# Patient Record
Sex: Male | Born: 1970 | Hispanic: No | Marital: Married | Smoking: Former smoker
Health system: Southern US, Community
[De-identification: ages and names within clinical notes are randomized; demographics above are authoritative.]

## PROBLEM LIST (undated history)

## (undated) DIAGNOSIS — Z9889 Other specified postprocedural states: Secondary | ICD-10-CM

## (undated) DIAGNOSIS — B20 Human immunodeficiency virus [HIV] disease: Secondary | ICD-10-CM

## (undated) DIAGNOSIS — F411 Generalized anxiety disorder: Secondary | ICD-10-CM

## (undated) DIAGNOSIS — K222 Esophageal obstruction: Secondary | ICD-10-CM

## (undated) DIAGNOSIS — Z5189 Encounter for other specified aftercare: Secondary | ICD-10-CM

## (undated) DIAGNOSIS — F4321 Adjustment disorder with depressed mood: Secondary | ICD-10-CM

## (undated) DIAGNOSIS — Z21 Asymptomatic human immunodeficiency virus [HIV] infection status: Secondary | ICD-10-CM

## (undated) DIAGNOSIS — D3610 Benign neoplasm of peripheral nerves and autonomic nervous system, unspecified: Secondary | ICD-10-CM

## (undated) DIAGNOSIS — J302 Other seasonal allergic rhinitis: Secondary | ICD-10-CM

## (undated) DIAGNOSIS — Z87442 Personal history of urinary calculi: Secondary | ICD-10-CM

## (undated) DIAGNOSIS — I639 Cerebral infarction, unspecified: Secondary | ICD-10-CM

## (undated) DIAGNOSIS — J189 Pneumonia, unspecified organism: Secondary | ICD-10-CM

## (undated) DIAGNOSIS — I10 Essential (primary) hypertension: Secondary | ICD-10-CM

## (undated) HISTORY — DX: Benign neoplasm of peripheral nerves and autonomic nervous system, unspecified: D36.10

## (undated) HISTORY — DX: Esophageal obstruction: K22.2

## (undated) HISTORY — PX: BRAIN SURGERY: SHX531

## (undated) HISTORY — DX: Generalized anxiety disorder: F41.1

## (undated) HISTORY — DX: Other specified postprocedural states: Z98.890

## (undated) HISTORY — DX: Other seasonal allergic rhinitis: J30.2

## (undated) HISTORY — PX: FRACTURE SURGERY: SHX138

## (undated) HISTORY — DX: Essential (primary) hypertension: I10

## (undated) HISTORY — PX: ESOPHAGOGASTRODUODENOSCOPY: SHX1529

## (undated) HISTORY — DX: Human immunodeficiency virus (HIV) disease: B20

## (undated) HISTORY — PX: COLONOSCOPY: SHX174

## (undated) HISTORY — DX: Encounter for other specified aftercare: Z51.89

## (undated) HISTORY — DX: Adjustment disorder with depressed mood: F43.21

---

## 2015-07-11 ENCOUNTER — Encounter (HOSPITAL_BASED_OUTPATIENT_CLINIC_OR_DEPARTMENT_OTHER): Payer: Self-pay

## 2015-07-11 ENCOUNTER — Emergency Department (HOSPITAL_BASED_OUTPATIENT_CLINIC_OR_DEPARTMENT_OTHER)
Admission: EM | Admit: 2015-07-11 | Discharge: 2015-07-11 | Disposition: A | Discharge status: Home / Self Care | Payer: Self-pay | Attending: Emergency Medicine | Admitting: Emergency Medicine

## 2015-07-11 DIAGNOSIS — Y939 Activity, unspecified: Secondary | ICD-10-CM | POA: Insufficient documentation

## 2015-07-11 DIAGNOSIS — X58XXXA Exposure to other specified factors, initial encounter: Secondary | ICD-10-CM | POA: Insufficient documentation

## 2015-07-11 DIAGNOSIS — Y998 Other external cause status: Secondary | ICD-10-CM | POA: Insufficient documentation

## 2015-07-11 DIAGNOSIS — T189XXA Foreign body of alimentary tract, part unspecified, initial encounter: Secondary | ICD-10-CM | POA: Insufficient documentation

## 2015-07-11 DIAGNOSIS — Y929 Unspecified place or not applicable: Secondary | ICD-10-CM | POA: Insufficient documentation

## 2015-07-11 HISTORY — DX: Cerebral infarction, unspecified: I63.9

## 2015-07-11 HISTORY — DX: Asymptomatic human immunodeficiency virus (hiv) infection status: Z21

## 2015-07-11 NOTE — ED Notes (Signed)
Pt angry with first ? Asked in triage cont'd angry to answer triage ?s-pt yelled at me to remove the food-calmly advised pt of triage process, ED limitations for removal and the possible need for endoscopy-pt angrily states he is leaving-adult male with pt talked him into staying-pt yelled at me to "just ask the damn questions"-explained to pt that any ED would need to ask preliminary ?s prior to any exam/treament-pt cont'd to be angry-left triage with fast paced gait-cursing-again pt was in no resp distress, was handling own secretions

## 2015-07-11 NOTE — ED Notes (Addendum)
Pt noted amb with quick steady gait, resp easy and reg, out of triage area by this nurse first. Pt shouts at this rn, "this is a fucking joke of an ER!" and walks out doors with his companion.

## 2015-07-11 NOTE — ED Notes (Signed)
Pt presents with c/o of having food stuck in throat-no resp distress

## 2015-08-29 ENCOUNTER — Ambulatory Visit (INDEPENDENT_AMBULATORY_CARE_PROVIDER_SITE_OTHER): Payer: BLUE CROSS/BLUE SHIELD

## 2015-08-29 ENCOUNTER — Ambulatory Visit (INDEPENDENT_AMBULATORY_CARE_PROVIDER_SITE_OTHER): Payer: BLUE CROSS/BLUE SHIELD | Admitting: Podiatry

## 2015-08-29 VITALS — BP 122/79 | HR 74 | Resp 16

## 2015-08-29 DIAGNOSIS — G5761 Lesion of plantar nerve, right lower limb: Secondary | ICD-10-CM

## 2015-08-29 DIAGNOSIS — M79673 Pain in unspecified foot: Secondary | ICD-10-CM

## 2015-08-29 DIAGNOSIS — M216X9 Other acquired deformities of unspecified foot: Secondary | ICD-10-CM | POA: Diagnosis not present

## 2015-08-29 DIAGNOSIS — G5781 Other specified mononeuropathies of right lower limb: Secondary | ICD-10-CM

## 2015-08-29 NOTE — Patient Instructions (Signed)
Morton Neuralgia  Morton neuralgia is a type of foot pain in the area closest to your toes. This area is sometimes called the ball of your foot. Morton neuralgia occurs when a branch of a nerve in your foot (digital nerve) becomes compressed.   When this happens over a long period of time, the nerve can thicken (neuroma) and cause pain. This usually occurs between the third and fourth toe. Morton neuralgia can come and go but may get worse over time.   CAUSES  Your digital nerve can become compressed and stretched at a point where it passes under a thick band of tissue that connects your toes (intermetatarsal ligament). Morton neuralgia can be caused by mild repetitive damage in this area. This type of damage can result from:   · Activities such as running or jumping.  · Wearing shoes that are too tight.  RISK FACTORS  You may be at risk for Morton neuralgia if you:  · Are male.  · Wear high heels.  · Wear shoes that are narrow or tight.  · Participate in activities that stretch your toes. These include:  ¨ Running.  ¨ Ballet.  ¨ Long-distance walking.  SIGNS AND SYMPTOMS  The first symptom of Morton neuralgia is pain that spreads from the ball of your foot to your toes. It may feel like you are walking on a marble. Pain usually gets worse with walking and goes away at night. Other symptoms may include numbness and cramping of your toes.  DIAGNOSIS   Your health care provider will do a physical exam. When doing the exam, your health care provider may:   · Squeeze your foot just behind your toe.  · Ask you to move your toes to check for pain.  You may also have tests on your foot to confirm the diagnosis. These may include:   · An X-ray.  · An MRI.  TREATMENT   Treatment for Morton neuralgia may be as simple as changing the kind of shoes you wear. Other treatments may include:  · Wearing a supportive pad (orthosis) under the front of your foot. This lifts your toe bones and takes pressure off the nerve.  · Getting  injections of numbing medicine and anti-inflammatory medicine (steroid) in the nerve.  · Having surgery to remove part of the thickened nerve.  HOME CARE INSTRUCTIONS   · Take medicine only as directed by your health care provider.  · Wear soft-soled shoes with a wide toe area.  · Stop activities that may be causing pain.  · Elevate your foot when resting.  · Massage your foot.  · Apply ice to the injured area:      Put ice in a plastic bag.    Place a towel between your skin and the bag.    Leave the ice on for 20 minutes, 2-3 times a day.    · Keep all follow-up visits as directed by your health care provider. This is important.  SEEK MEDICAL CARE IF:  · Home care instructions are not helping you get better.  · Your symptoms change or get worse.     This information is not intended to replace advice given to you by your health care provider. Make sure you discuss any questions you have with your health care provider.     Document Released: 08/18/2000 Document Revised: 06/02/2014 Document Reviewed: 07/13/2013  Elsevier Interactive Patient Education ©2016 [employer]

## 2015-08-29 NOTE — Progress Notes (Signed)
   Subjective:    Patient ID: Bradley Chandler, male    DOB: [birth year], 45 y.o.   MRN: [id number]  HPI    Review of Systems  All other systems reviewed and are negative.      Objective:   Physical Exam        Assessment & Plan:

## 2015-08-30 NOTE — Progress Notes (Signed)
Subjective:     Patient ID: Bradley Chandler, male   DOB: [birth year], 45 y.o.   MRN: [id number]  HPI patient states I've had shooting pain with radiating-like discomfort between the third and fourth toe on my right foot that a been present for several years. I'm very active and it done triathlons in the past and the discomfort and numbness has reduced my ability to be active   Review of Systems  All other systems reviewed and are negative.      Objective:   Physical Exam  Constitutional: He is oriented to person, place, and time.  Cardiovascular: Intact distal pulses.   Musculoskeletal: Normal range of motion.  Neurological: He is oriented to person, place, and time.  Skin: Skin is warm.  Nursing note and vitals reviewed.  neurovascular status intact muscle strength adequate range of motion within normal limits with patient found to have shooting discomforts between the third and fourth toe with the right foot with radiating-like pain and a positive Mulder sign when palpated. 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 Reappoint 2 weeks or earlier if any issues were to occur

## 2015-09-13 ENCOUNTER — Ambulatory Visit (INDEPENDENT_AMBULATORY_CARE_PROVIDER_SITE_OTHER): Payer: BLUE CROSS/BLUE SHIELD | Admitting: Podiatry

## 2015-09-13 ENCOUNTER — Encounter: Payer: Self-pay | Admitting: Podiatry

## 2015-09-13 DIAGNOSIS — G5761 Lesion of plantar nerve, right lower limb: Secondary | ICD-10-CM

## 2015-09-13 DIAGNOSIS — G5781 Other specified mononeuropathies of right lower limb: Secondary | ICD-10-CM

## 2015-09-13 NOTE — Patient Instructions (Signed)
Pre-Operative Instructions  Congratulations, you have decided to take an important step to improving your quality of life.  You can be assured that the doctors of [facility] will be with you every step of the way.  1. Plan to be at the surgery center/hospital at least 1 (one) hour prior to your scheduled time unless otherwise directed by the surgical center/hospital staff.  You must have a responsible adult accompany you, remain during the surgery and drive you home.  Make sure you have directions to the surgical center/hospital and know how to get there on time. 2. For hospital based surgery you will need to obtain a history and physical form from your family physician within 1 month prior to the date of surgery- we will give you a form for you primary physician.  3. We make every effort to accommodate the date you request for surgery.  There are however, times where surgery dates or times have to be moved.  We will contact you as soon as possible if a change in schedule is required.   4. No Aspirin/Ibuprofen for one week before surgery.  If you are on aspirin, any non-steroidal anti-inflammatory medications (Mobic, Aleve, Ibuprofen) you should stop taking it 7 days prior to your surgery.  You make take Tylenol  For pain prior to surgery.  5. Medications- If you are taking daily heart and blood pressure medications, seizure, reflux, allergy, asthma, anxiety, pain or diabetes medications, make sure the surgery center/hospital is aware before the day of surgery so they may notify you which medications to take or avoid the day of surgery. 6. No food or drink after midnight the night before surgery unless directed otherwise by surgical center/hospital staff. 7. No alcoholic beverages 24 hours prior to surgery.  No smoking 24 hours prior to or 24 hours after surgery. 8. Wear loose pants or shorts- loose enough to fit over bandages, boots, and casts. 9. No slip on shoes, sneakers are best. 10. Bring  your boot with you to the surgery center/hospital.  Also bring crutches or a walker if your physician has prescribed it for you.  If you do not have this equipment, it will be provided for you after surgery. 11. If you have not been contracted by the surgery center/hospital by the day before your surgery, call to confirm the date and time of your surgery. 12. Leave-time from work may vary depending on the type of surgery you have.  Appropriate arrangements should be made prior to surgery with your employer. 13. Prescriptions will be provided immediately following surgery by your doctor.  Have these filled as soon as possible after surgery and take the medication as directed. 14. Remove nail polish on the operative foot. 15. Wash the night before surgery.  The night before surgery wash the foot and leg well with the antibacterial soap provided and water paying special attention to beneath the toenails and in between the toes.  Rinse thoroughly with water and dry well with a towel.  Perform this wash unless told not to do so by your physician.  Enclosed: 1 Ice pack (please put in freezer the night before surgery)   1 Hibiclens skin cleaner   Pre-op Instructions  If you have any questions regarding the instructions, do not hesitate to call our office.  [place]: [address] [place], [place] [address] [phone]  [place]: [address] [place], [place] [address] [phone]  [place]: [address]  [place], [place] [address] [phone]  Dr. Richard   Tuchman DPM, Dr. Ej Pinson DPM Dr. Richard Sikora DPM, Dr. M. Todd Hyatt DPM, Dr. Kathryn Egerton DPM 

## 2015-09-16 NOTE — Progress Notes (Signed)
Subjective:     Patient ID: Bradley Chandler, male   DOB: 09/24/1970, 45 y.o.   MRN: [id number]  HPI patient states he had 1-1/2 days of complete relief from the neuroma injection and now has recurrence of symptoms and would like to have it taken care of permanently   Review of Systems     Objective:   Physical Exam Neurovascular status intact muscle strength adequate with positive Biagio Borg sign with a palpable mass within the third interspace right that is painful when I pressed the metatarsals together and pushed on the interspace. Negative pain within the capsules of the third and fourth metatarsals    Assessment:     Strong probability for neuroma symptomatology right    Plan:     Due to the response to the conservative injection treatment and the area that the pain is and I do believe we are dealing with neuroma symptomatology and I discussed neurectomy. Patient wants the surgery and at this time I allowed him to read consent form reviewing alternative treatments and complications associated with surgery and the fact that there is no long-term guarantees as far as long-term success and he will develop some numbness between the toes. Patient understands all of this signs consent form and understands recovery can take approximately 3-6 months and is scheduled for outpatient surgery. He is encouraged to call with any questions prior to procedure

## 2015-09-27 ENCOUNTER — Telehealth: Payer: Self-pay | Admitting: *Deleted

## 2015-09-27 NOTE — Telephone Encounter (Signed)
"  I want to postpone my surgery scheduled for Tuesday."  Do you have a date you would like to reschedule to?  "No, I don't want to do it at this time.  I have some family issues I have to take care of.  I'm actually headed out of town now for that reason."  I'll let Dr. Paulla Dolly know and I will cancel it at the surgical center.  "Will you cancel my follow-up appointment too?"  Yes, I'll take care of that as well.  I called and left a message for Caren Griffins at the surgical center to cancel surgery.

## 2015-09-27 NOTE — Telephone Encounter (Signed)
"  I'm scheduled for surgery on this Tuesday.  I actually need to postpone that.  If you could give me a phone call back."

## 2015-10-10 ENCOUNTER — Ambulatory Visit (INDEPENDENT_AMBULATORY_CARE_PROVIDER_SITE_OTHER): Payer: Medicare Other | Admitting: Infectious Disease

## 2015-10-10 ENCOUNTER — Encounter: Payer: Self-pay | Admitting: Infectious Disease

## 2015-10-10 VITALS — BP 129/90 | HR 59 | Temp 98.1°F | Ht 66.0 in | Wt 150.8 lb

## 2015-10-10 DIAGNOSIS — Q394 Esophageal web: Secondary | ICD-10-CM | POA: Diagnosis not present

## 2015-10-10 DIAGNOSIS — B20 Human immunodeficiency virus [HIV] disease: Secondary | ICD-10-CM

## 2015-10-10 DIAGNOSIS — F411 Generalized anxiety disorder: Secondary | ICD-10-CM

## 2015-10-10 DIAGNOSIS — Z9889 Other specified postprocedural states: Secondary | ICD-10-CM

## 2015-10-10 DIAGNOSIS — G5762 Lesion of plantar nerve, left lower limb: Secondary | ICD-10-CM | POA: Insufficient documentation

## 2015-10-10 DIAGNOSIS — D3610 Benign neoplasm of peripheral nerves and autonomic nervous system, unspecified: Secondary | ICD-10-CM

## 2015-10-10 DIAGNOSIS — I10 Essential (primary) hypertension: Secondary | ICD-10-CM

## 2015-10-10 DIAGNOSIS — K222 Esophageal obstruction: Secondary | ICD-10-CM

## 2015-10-10 DIAGNOSIS — F4321 Adjustment disorder with depressed mood: Secondary | ICD-10-CM

## 2015-10-10 DIAGNOSIS — J302 Other seasonal allergic rhinitis: Secondary | ICD-10-CM

## 2015-10-10 HISTORY — DX: Other seasonal allergic rhinitis: J30.2

## 2015-10-10 HISTORY — DX: Adjustment disorder with depressed mood: F43.21

## 2015-10-10 HISTORY — DX: Other specified postprocedural states: Z98.890

## 2015-10-10 HISTORY — DX: Esophageal obstruction: K22.2

## 2015-10-10 HISTORY — DX: Benign neoplasm of peripheral nerves and autonomic nervous system, unspecified: D36.10

## 2015-10-10 HISTORY — DX: Generalized anxiety disorder: F41.1

## 2015-10-10 HISTORY — DX: Essential (primary) hypertension: I10

## 2015-10-10 HISTORY — DX: Human immunodeficiency virus (HIV) disease: B20

## 2015-10-10 NOTE — Progress Notes (Signed)
Subjective:   Chief complaint: patient desires to have ID MD and PCP in one clinic   Patient ID: Bradley Chandler, male    DOB: [birth year], 45 y.o.   MRN: [id number]  HPI  45 year old man with HIV that has been perfectly controlled with "undetectable viral load for 22 years" most recently on Atripla, then [place] and now Atripla again. He previously was taken care of by prominent ID MD in [place] then by MD in [place]. He has been taken care of since move to [place] (lives in [place]) by Dr. Remonia Richter. Dr. Rebekah Chesterfield had reached out to me to see if I could take on patient's care here in [place] since it would be closer and more convenient for the patient.   Apparently the larger issue is that the patient wishes to have an MD who is both his "HIV/ID doctor and his primary care physician:" He wanted to know if I could do both and I said I could within certain limitations--for example I am not comfortable managing insulin.  He was not sufficiently convinced that I could apparently and I proposed the idea of him seeing Dr. Redmond School who practices primary care and I believe also manages several patients HIV.   I offered to continue to be his ID provider and encouraged him to make a followup appt with me.   One item of concern for him is to have his lorazepam renewed. I told him that OUR clinic does not prescribe benzodiazepenes, narcotics or other controlled substance to new patients due to the massive problems with diversion that we dealt with in the past.  Past Medical History  Diagnosis Date  . HIV positive ([place])   . Stroke ([place])   . HIV disease ([place]) 10/10/2015  . Schatzki's ring 10/10/2015  . History of esophagogastroduodenoscopy (EGD) 10/10/2015  . Neuroma 10/10/2015  . GAD (generalized anxiety disorder) 10/10/2015  . Benign essential HTN 10/10/2015  . Seasonal allergies 10/10/2015  . Situational depression 10/10/2015    No past surgical history on file.  No family history on  file.    Social History   Social History  . Marital Status: Married    Spouse Name: N/A  . Number of Children: N/A  . Years of Education: N/A   Social History Main Topics  . Smoking status: Never Smoker   . Smokeless tobacco: None  . Alcohol Use: None  . Drug Use: None  . Sexual Activity: Not Asked   Other Topics Concern  . None   Social History Narrative    Allergies  Allergen Reactions  . Other Other (See Comments)    Dairy-severe abd pain     Current outpatient prescriptions:  .  buPROPion (WELLBUTRIN XL) 300 MG 24 hr tablet, Take 300 mg by mouth., Disp: , Rfl:  .  carvedilol (COREG) 6.25 MG tablet, , Disp: , Rfl:  .  efavirenz-emtricitabine-tenofovir (ATRIPLA) 600-200-300 MG tablet, Take by mouth., Disp: , Rfl:  .  fluticasone (FLONASE) 50 MCG/ACT nasal spray, , Disp: , Rfl:  .  fluticasone (FLONASE) 50 MCG/ACT nasal spray, , Disp: , Rfl:  .  glucosamine-chondroitin (GLUCOSAMINE-CHONDROITIN DS) 500-400 MG tablet, Take by mouth., Disp: , Rfl:  .  hydrochlorothiazide (HYDRODIURIL) 12.5 MG tablet, Take 12.5 mg by mouth., Disp: , Rfl:  .  Multiple Vitamin (MULTIVITAMIN) capsule, Take by mouth., Disp: , Rfl:  .  Omega-3 1000 MG CAPS, Take by mouth., Disp: , Rfl:  .  sertraline (  ZOLOFT) 50 MG tablet, Take 50 mg by mouth., Disp: , Rfl:    Review of Systems  Constitutional: Negative for fever, chills, diaphoresis, activity change, appetite change, fatigue and unexpected weight change.  HENT: Negative for congestion, rhinorrhea, sinus pressure, sneezing, sore throat and trouble swallowing.   Eyes: Negative for photophobia and visual disturbance.  Respiratory: Negative for cough, chest tightness, shortness of breath, wheezing and stridor.   Cardiovascular: Negative for chest pain, palpitations and leg swelling.  Gastrointestinal: Negative for nausea, vomiting, abdominal pain, diarrhea, constipation, blood in stool, abdominal distention and anal bleeding.  Genitourinary:  Negative for dysuria, hematuria, flank pain and difficulty urinating.  Musculoskeletal: Negative for myalgias, back pain, joint swelling, arthralgias and gait problem.  Skin: Negative for color change, pallor, rash and wound.  Neurological: Negative for dizziness, tremors, weakness and light-headedness.  Hematological: Negative for adenopathy. Does not bruise/bleed easily.  Psychiatric/Behavioral: Negative for behavioral problems, confusion, sleep disturbance, dysphoric mood, decreased concentration and agitation.       Objective:   Physical Exam  Constitutional: He is oriented to person, place, and time. He appears well-developed and well-nourished.  HENT:  Head: Normocephalic and atraumatic.  Eyes: Conjunctivae and EOM are normal.  Neck: Normal range of motion. Neck supple.  Cardiovascular: Normal rate and regular rhythm.   Pulmonary/Chest: Effort normal. No respiratory distress. He has no wheezes.  Abdominal: Soft. He exhibits no distension.  Musculoskeletal: Normal range of motion. He exhibits no edema or tenderness.  Neurological: He is alert and oriented to person, place, and time.  Skin: Skin is warm and dry. No rash noted. No erythema. No pallor.  Psychiatric: He has a normal mood and affect. Judgment and thought content normal.          Assessment & Plan:   HIV: IF he wished to have me be his ID MD that we might wish to "tinker" with his STR to find one that is more bone and kidney friendly than his current TDF based one with Atripla. Similarly the EFV not as desirable in a patient with depression and anxiety  Anxiety and depression: on SSRI and also lorazepam that he is getting from MD in [facility].   HTN: BP well controlled  I am referring him to Dr. Redmond School for primary care. Perhaps that situation will be more to his liking. I dont think the idea of an ID MD being a sub specialist and a patient needing also to have a Primary Care MD is unique to [place]. Certainly he would  benefit from standpoint of access to have both an ID MD and a primary care MD.

## 2015-10-12 ENCOUNTER — Other Ambulatory Visit: Payer: Self-pay

## 2015-10-15 ENCOUNTER — Telehealth: Payer: Self-pay | Admitting: Family Medicine

## 2015-10-15 NOTE — Telephone Encounter (Signed)
Dr. Redmond School request I call pt to see if he wanted him to care for his HIV care as he had been referred by his ID doc.  No answer lmtrc

## 2015-11-01 ENCOUNTER — Encounter: Payer: Self-pay | Admitting: *Deleted

## 2016-01-10 ENCOUNTER — Ambulatory Visit: Payer: BLUE CROSS/BLUE SHIELD | Admitting: Infectious Disease

## 2016-01-16 ENCOUNTER — Telehealth: Payer: Self-pay | Admitting: *Deleted

## 2016-01-16 NOTE — Telephone Encounter (Signed)
Called the patient to advise that [facility] wants him to have a PCP and we arranged for him to be seen by Dr Demetrios Isaacs and the patient has not responded to their calls. Since his insurance does not require a referral for PCP sent him a list to chose from to make his own appt when he wants.

## 2017-04-24 DIAGNOSIS — Z006 Encounter for examination for normal comparison and control in clinical research program: Secondary | ICD-10-CM | POA: Insufficient documentation

## 2018-07-03 ENCOUNTER — Emergency Department (HOSPITAL_BASED_OUTPATIENT_CLINIC_OR_DEPARTMENT_OTHER)
Admission: EM | Admit: 2018-07-03 | Discharge: 2018-07-03 | Disposition: A | Discharge status: Home / Self Care | Payer: BLUE CROSS/BLUE SHIELD | Attending: Emergency Medicine | Admitting: Emergency Medicine

## 2018-07-03 ENCOUNTER — Other Ambulatory Visit: Payer: Self-pay

## 2018-07-03 ENCOUNTER — Encounter (HOSPITAL_BASED_OUTPATIENT_CLINIC_OR_DEPARTMENT_OTHER): Payer: Self-pay | Admitting: *Deleted

## 2018-07-03 DIAGNOSIS — Y9389 Activity, other specified: Secondary | ICD-10-CM | POA: Insufficient documentation

## 2018-07-03 DIAGNOSIS — Z21 Asymptomatic human immunodeficiency virus [HIV] infection status: Secondary | ICD-10-CM | POA: Insufficient documentation

## 2018-07-03 DIAGNOSIS — Y929 Unspecified place or not applicable: Secondary | ICD-10-CM | POA: Diagnosis not present

## 2018-07-03 DIAGNOSIS — Z79899 Other long term (current) drug therapy: Secondary | ICD-10-CM | POA: Insufficient documentation

## 2018-07-03 DIAGNOSIS — Y999 Unspecified external cause status: Secondary | ICD-10-CM | POA: Insufficient documentation

## 2018-07-03 DIAGNOSIS — W25XXXA Contact with sharp glass, initial encounter: Secondary | ICD-10-CM | POA: Insufficient documentation

## 2018-07-03 DIAGNOSIS — S61212A Laceration without foreign body of right middle finger without damage to nail, initial encounter: Secondary | ICD-10-CM | POA: Diagnosis present

## 2018-07-03 DIAGNOSIS — I10 Essential (primary) hypertension: Secondary | ICD-10-CM | POA: Insufficient documentation

## 2018-07-03 MED ORDER — LIDOCAINE HCL (PF) 1 % IJ SOLN
5.0000 mL | Freq: Once | INTRAMUSCULAR | Status: AC
  Administered 2018-07-03: 5 mL via INTRADERMAL
  Filled 2018-07-03: qty 5

## 2018-07-03 MED ORDER — BACITRACIN ZINC 500 UNIT/GM EX OINT
TOPICAL_OINTMENT | Freq: Once | CUTANEOUS | Status: AC
  Administered 2018-07-03: 1 via TOPICAL

## 2018-07-03 NOTE — ED Triage Notes (Signed)
Laceration to right hand fingers from crock pot insert breaking

## 2018-07-03 NOTE — Discharge Instructions (Signed)
Keep the wound clean and dry for the first 24 hours. After that you may gently clean the wound with soap and water. Make sure to pat dry the wound before covering it with any dressing. You can use topical antibiotic ointment and bandage. Ice and elevate for pain relief.  ° °You can take Tylenol or Ibuprofen as directed for pain. You can alternate Tylenol and Ibuprofen every 4 hours for additional pain relief.  ° °Return to the Emergency Department, your primary care doctor, or the [facility] in 5-7 days for suture removal.  ° °Monitor closely for any signs of infection. Return to the Emergency Department for any worsening redness/swelling of the area that begins to spread, drainage from the site, worsening pain, fever or any other worsening or concerning symptoms.  ° ° °

## 2018-07-03 NOTE — ED Notes (Signed)
PMS intact before and after. Pt tolerated well. All questions answered. 

## 2018-07-03 NOTE — ED Provider Notes (Signed)
[place] EMERGENCY DEPARTMENT Provider Note   CSN: [id number] Arrival date & time: 07/03/18  2057     History   Chief Complaint Chief Complaint  Patient presents with  . Laceration    HPI Bradley Chandler is a 48 y.o. male for evaluation of right 3rd digit laceration that occurred just prior to ED arrival.  Patient reports that he was using a crockpot and states that when he put it away, piece of the crockpot broke, breaking on the volar aspect of his finger.  His tetanus is up-to-date.  He denies any numbness/weakness.  The history is provided by the patient.    Past Medical History:  Diagnosis Date  . Benign essential HTN 10/10/2015  . GAD (generalized anxiety disorder) 10/10/2015  . History of esophagogastroduodenoscopy (EGD) 10/10/2015  . HIV disease ([place]) 10/10/2015  . HIV positive ([place])   . Neuroma 10/10/2015  . Schatzki's ring 10/10/2015  . Seasonal allergies 10/10/2015  . Situational depression 10/10/2015  . Stroke [facility])     Patient Active Problem List   Diagnosis Date Noted  . HIV disease ([place]) 10/10/2015  . Schatzki's ring 10/10/2015  . History of esophagogastroduodenoscopy (EGD) 10/10/2015  . Neuroma 10/10/2015  . GAD (generalized anxiety disorder) 10/10/2015  . Benign essential HTN 10/10/2015  . Seasonal allergies 10/10/2015  . Situational depression 10/10/2015    Past Surgical History:  Procedure Laterality Date  . BRAIN SURGERY          Home Medications    Prior to Admission medications   Medication Sig Start Date End Date Taking? Authorizing Provider  bictegravir-emtricitabine-tenofovir AF (BIKTARVY) 50-200-25 MG TABS tablet Take by mouth daily.   Yes [provider]  buPROPion (WELLBUTRIN XL) 300 MG 24 hr tablet Take 300 mg by mouth. 07/13/15  Yes [provider]  carvedilol (COREG) 6.25 MG tablet  06/14/15  Yes [provider]  fluticasone Asencion Islam) 50 MCG/ACT nasal spray  07/13/15 07/03/18 Yes [provider]  glucosamine-chondroitin (GLUCOSAMINE-CHONDROITIN DS) 500-400 MG tablet Take by mouth.   Yes [provider]  hydrochlorothiazide (HYDRODIURIL) 12.5 MG tablet Take 12.5 mg by mouth. 03/15/15 07/03/18 Yes [provider]  Multiple Vitamin (MULTIVITAMIN) capsule Take by mouth.   Yes [provider]  Omega-3 1000 MG CAPS Take by mouth.   Yes [provider]  efavirenz-emtricitabine-tenofovir (ATRIPLA) 600-200-300 MG tablet Take by mouth. 07/27/15 07/26/16  [provider]  fluticasone Asencion Islam) 50 MCG/ACT nasal spray  07/13/15 07/12/16  [provider]  sertraline (ZOLOFT) 50 MG tablet Take 50 mg by mouth. 05/11/15   [provider]    Family History No family history on file.  Social History Social History   Tobacco Use  . Smoking status: Never Smoker  . Smokeless tobacco: Never Used  Substance Use Topics  . Alcohol use: Yes    Alcohol/week: 0.0 standard drinks    Comment: 2x month  . Drug use: Never     Allergies   Other   Review of Systems Review of Systems  Skin: Positive for wound.  Neurological: Negative for weakness and numbness.  All other systems reviewed and are negative.    Physical Exam Updated Vital Signs BP (!) 120/93 (BP Location: Left Arm)   Pulse 74   Temp 97.7 F (36.5 C) (Oral)   Resp 18   Ht 5\' 6"  (1.676 m)   Wt 72.6 kg   SpO2 96%   BMI 25.82 kg/m   Physical Exam Vitals signs and  nursing note reviewed.  Constitutional:      Appearance: He is well-developed.  HENT:     Head: Normocephalic and atraumatic.  Eyes:     General: No scleral icterus.       Right eye: No discharge.        Left eye: No discharge.     Conjunctiva/sclera: Conjunctivae normal.  Cardiovascular:     Pulses:          Radial pulses are 2+ on the right side and 2+ on the left side.  Pulmonary:     Effort: Pulmonary effort is normal.  Musculoskeletal:     Comments: Flexion/extension of right third  digit intact with any difficulty.  He can flex and extend at both the PIP and the DIP joint.  Skin:    General: Skin is warm and dry.     Capillary Refill: Capillary refill takes less than 2 seconds.     Comments: Good distal cap refill.  RUE is not dusky in appearance or cool to touch.  2 cm V-shaped jagged laceration noted to the volar aspect of the right third digit overlying the PIP joint.  Neurological:     Mental Status: He is alert.     Comments: Sensation intact along major nerve distributions of RUE  Psychiatric:        Speech: Speech normal.        Behavior: Behavior normal.      ED Treatments / Results  Labs (all labs ordered are listed, but only abnormal results are displayed) Labs Reviewed - No data to display  EKG None  Radiology No results found.  Procedures .Marland KitchenLaceration Repair Date/Time: 07/03/2018 9:48 PM Performed by: Volanda Napoleon, PA-C Authorized by: Volanda Napoleon, PA-C   Consent:    Consent obtained:  Verbal   Consent given by:  Patient   Risks discussed:  Infection, need for additional repair, pain, poor cosmetic result and poor wound healing   Alternatives discussed:  No treatment and delayed treatment Universal protocol:    Procedure explained and questions answered to patient or proxy's satisfaction: yes     Relevant documents present and verified: yes     Test results available and properly labeled: yes     Imaging studies available: yes     Required blood products, implants, devices, and special equipment available: yes     Site/side marked: yes     Immediately prior to procedure, a time out was called: yes     Patient identity confirmed:  Verbally with patient Anesthesia (see MAR for exact dosages):    Anesthesia method:  Local infiltration Laceration details:    Location:  Finger   Finger location:  R long finger   Length (cm):  2 Repair type:    Repair type:  Simple Pre-procedure details:    Preparation:  Patient was prepped  and draped in usual sterile fashion Exploration:    Hemostasis achieved with:  Direct pressure   Wound exploration: wound explored through full range of motion     Wound extent: no foreign bodies/material noted and no tendon damage noted     Contaminated: no   Treatment:    Area cleansed with:  Betadine   Amount of cleaning:  Extensive   Irrigation solution:  Sterile saline   Irrigation method:  Syringe   Visualized foreign bodies/material removed: no   Skin repair:    Repair method:  Sutures   Suture size:  4-0   Suture material:  Nylon   Suture technique:  Simple interrupted   Number of sutures:  5 Approximation:    Approximation:  Close Post-procedure details:    Dressing:  Antibiotic ointment, non-adherent dressing and splint for protection   Patient tolerance of procedure:  Tolerated well, no immediate complications Comments:     After the wound was anesthetized, was thoroughly and extensively explored.  Patient with no evidence of tendon injury.  The wound appeared superficial and did not go down to bone.  Evidence of foreign body was noted.  It was extensively and thoroughly irrigated with sterile saline.   (including critical care time)  Medications Ordered in ED Medications  lidocaine (PF) (XYLOCAINE) 1 % injection 5 mL (5 mLs Intradermal Given 07/03/18 2125)  bacitracin ointment (1 application Topical Given 07/03/18 2158)     Initial Impression / Assessment and Plan / ED Course  I have reviewed the triage vital signs and the nursing notes.  Pertinent labs & imaging results that were available during my care of the patient were reviewed by me and considered in my medical decision making (see chart for details).     48 year old male who presents for evaluation of finger laceration that occurred just prior to ED arrival.  He reports he was washing a crockpot when a piece of it broke and sliced the volar aspect of his right third digit.  His tetanus is up-to-date. Patient  is afebrile, non-toxic appearing, sitting comfortably on examination table. Vital signs reviewed and stable.  Patient is neurovascularly intact.  On exam, he has a 2 cm jagged laceration noted volar aspect of right third digit.  Plan for repair.  Laceration repaired as documented above.  Patient tolerated procedure well. At this time, patient exhibits no emergent life-threatening condition that require further evaluation in ED. Patient had ample opportunity for questions and discussion. All patient's questions were answered with full understanding. Strict return precautions discussed. Patient expresses understanding and agreement to plan.   Portions of this note were generated with Lobby[employer]. Dictation errors may occur despite best attempts at proofreading.   Final Clinical Impressions(s) / ED Diagnoses   Final diagnoses:  Laceration of right middle finger without foreign body without damage to nail, initial encounter    ED Discharge Orders    None       Desma Mcgregor 07/03/18 [place], DO 07/03/18 2258

## 2018-07-03 NOTE — ED Notes (Signed)
ED Provider at bedside. 

## 2018-07-03 NOTE — ED Notes (Signed)
Pt understood dc material and follow up information. NAD noted. All questions answered to satisfaction.

## 2019-10-20 DIAGNOSIS — B20 Human immunodeficiency virus [HIV] disease: Secondary | ICD-10-CM | POA: Diagnosis not present

## 2019-10-20 DIAGNOSIS — Z6825 Body mass index (BMI) 25.0-25.9, adult: Secondary | ICD-10-CM | POA: Diagnosis not present

## 2019-11-24 DIAGNOSIS — M79644 Pain in right finger(s): Secondary | ICD-10-CM | POA: Insufficient documentation

## 2019-11-24 DIAGNOSIS — S62616A Displaced fracture of proximal phalanx of right little finger, initial encounter for closed fracture: Secondary | ICD-10-CM | POA: Diagnosis not present

## 2019-11-25 DIAGNOSIS — X58XXXA Exposure to other specified factors, initial encounter: Secondary | ICD-10-CM | POA: Diagnosis not present

## 2019-11-25 DIAGNOSIS — G8918 Other acute postprocedural pain: Secondary | ICD-10-CM | POA: Diagnosis not present

## 2019-11-25 DIAGNOSIS — S62616A Displaced fracture of proximal phalanx of right little finger, initial encounter for closed fracture: Secondary | ICD-10-CM | POA: Diagnosis not present

## 2019-11-25 DIAGNOSIS — S62619A Displaced fracture of proximal phalanx of unspecified finger, initial encounter for closed fracture: Secondary | ICD-10-CM | POA: Insufficient documentation

## 2019-11-25 DIAGNOSIS — Y999 Unspecified external cause status: Secondary | ICD-10-CM | POA: Diagnosis not present

## 2019-12-02 DIAGNOSIS — S62616D Displaced fracture of proximal phalanx of right little finger, subsequent encounter for fracture with routine healing: Secondary | ICD-10-CM | POA: Diagnosis not present

## 2019-12-02 DIAGNOSIS — M25641 Stiffness of right hand, not elsewhere classified: Secondary | ICD-10-CM | POA: Diagnosis not present

## 2019-12-20 DIAGNOSIS — S62616D Displaced fracture of proximal phalanx of right little finger, subsequent encounter for fracture with routine healing: Secondary | ICD-10-CM | POA: Diagnosis not present

## 2019-12-27 DIAGNOSIS — M25641 Stiffness of right hand, not elsewhere classified: Secondary | ICD-10-CM | POA: Diagnosis not present

## 2020-01-06 DIAGNOSIS — M25641 Stiffness of right hand, not elsewhere classified: Secondary | ICD-10-CM | POA: Diagnosis not present

## 2020-01-10 DIAGNOSIS — S62616D Displaced fracture of proximal phalanx of right little finger, subsequent encounter for fracture with routine healing: Secondary | ICD-10-CM | POA: Diagnosis not present

## 2020-01-11 DIAGNOSIS — M7542 Impingement syndrome of left shoulder: Secondary | ICD-10-CM | POA: Diagnosis not present

## 2020-01-12 DIAGNOSIS — M25641 Stiffness of right hand, not elsewhere classified: Secondary | ICD-10-CM | POA: Diagnosis not present

## 2020-01-20 DIAGNOSIS — M25641 Stiffness of right hand, not elsewhere classified: Secondary | ICD-10-CM | POA: Diagnosis not present

## 2020-02-07 DIAGNOSIS — S62616D Displaced fracture of proximal phalanx of right little finger, subsequent encounter for fracture with routine healing: Secondary | ICD-10-CM | POA: Diagnosis not present

## 2020-02-13 DIAGNOSIS — M20012 Mallet finger of left finger(s): Secondary | ICD-10-CM | POA: Diagnosis not present

## 2020-02-13 DIAGNOSIS — M20011 Mallet finger of right finger(s): Secondary | ICD-10-CM | POA: Diagnosis not present

## 2020-02-17 DIAGNOSIS — M79644 Pain in right finger(s): Secondary | ICD-10-CM | POA: Diagnosis not present

## 2020-02-17 DIAGNOSIS — S62616D Displaced fracture of proximal phalanx of right little finger, subsequent encounter for fracture with routine healing: Secondary | ICD-10-CM | POA: Diagnosis not present

## 2020-02-20 DIAGNOSIS — M7741 Metatarsalgia, right foot: Secondary | ICD-10-CM | POA: Diagnosis not present

## 2020-02-20 DIAGNOSIS — M79671 Pain in right foot: Secondary | ICD-10-CM | POA: Insufficient documentation

## 2020-03-15 DIAGNOSIS — S62616D Displaced fracture of proximal phalanx of right little finger, subsequent encounter for fracture with routine healing: Secondary | ICD-10-CM | POA: Diagnosis not present

## 2020-04-06 DIAGNOSIS — S62616D Displaced fracture of proximal phalanx of right little finger, subsequent encounter for fracture with routine healing: Secondary | ICD-10-CM | POA: Diagnosis not present

## 2020-04-24 DIAGNOSIS — S62616D Displaced fracture of proximal phalanx of right little finger, subsequent encounter for fracture with routine healing: Secondary | ICD-10-CM | POA: Diagnosis not present

## 2020-07-26 DIAGNOSIS — Z1159 Encounter for screening for other viral diseases: Secondary | ICD-10-CM | POA: Diagnosis not present

## 2020-07-26 DIAGNOSIS — Z79899 Other long term (current) drug therapy: Secondary | ICD-10-CM | POA: Diagnosis not present

## 2020-07-26 DIAGNOSIS — Z7185 Encounter for immunization safety counseling: Secondary | ICD-10-CM | POA: Diagnosis not present

## 2020-07-26 DIAGNOSIS — B20 Human immunodeficiency virus [HIV] disease: Secondary | ICD-10-CM | POA: Diagnosis not present

## 2020-07-26 DIAGNOSIS — Z13220 Encounter for screening for lipoid disorders: Secondary | ICD-10-CM | POA: Diagnosis not present

## 2020-07-26 DIAGNOSIS — Z9189 Other specified personal risk factors, not elsewhere classified: Secondary | ICD-10-CM | POA: Diagnosis not present

## 2020-07-26 DIAGNOSIS — Z113 Encounter for screening for infections with a predominantly sexual mode of transmission: Secondary | ICD-10-CM | POA: Diagnosis not present

## 2020-07-26 DIAGNOSIS — Z7189 Other specified counseling: Secondary | ICD-10-CM | POA: Diagnosis not present

## 2020-07-26 DIAGNOSIS — G4700 Insomnia, unspecified: Secondary | ICD-10-CM | POA: Diagnosis not present

## 2020-07-26 DIAGNOSIS — Z0184 Encounter for antibody response examination: Secondary | ICD-10-CM | POA: Diagnosis not present

## 2020-11-12 DIAGNOSIS — M238X1 Other internal derangements of right knee: Secondary | ICD-10-CM | POA: Diagnosis not present

## 2020-11-13 ENCOUNTER — Ambulatory Visit: Payer: BLUE CROSS/BLUE SHIELD | Admitting: Orthopedic Surgery

## 2020-11-14 DIAGNOSIS — M2391 Unspecified internal derangement of right knee: Secondary | ICD-10-CM | POA: Diagnosis not present

## 2020-11-29 DIAGNOSIS — M2391 Unspecified internal derangement of right knee: Secondary | ICD-10-CM | POA: Diagnosis not present

## 2020-11-29 DIAGNOSIS — S8001XA Contusion of right knee, initial encounter: Secondary | ICD-10-CM | POA: Diagnosis not present

## 2021-01-24 DIAGNOSIS — Z79899 Other long term (current) drug therapy: Secondary | ICD-10-CM | POA: Diagnosis not present

## 2021-01-24 DIAGNOSIS — Z113 Encounter for screening for infections with a predominantly sexual mode of transmission: Secondary | ICD-10-CM | POA: Diagnosis not present

## 2021-01-24 DIAGNOSIS — R6882 Decreased libido: Secondary | ICD-10-CM | POA: Diagnosis not present

## 2021-01-24 DIAGNOSIS — B20 Human immunodeficiency virus [HIV] disease: Secondary | ICD-10-CM | POA: Diagnosis not present

## 2021-01-24 DIAGNOSIS — I10 Essential (primary) hypertension: Secondary | ICD-10-CM | POA: Diagnosis not present

## 2021-01-24 DIAGNOSIS — Z7185 Encounter for immunization safety counseling: Secondary | ICD-10-CM | POA: Diagnosis not present

## 2021-01-24 DIAGNOSIS — Z111 Encounter for screening for respiratory tuberculosis: Secondary | ICD-10-CM | POA: Diagnosis not present

## 2021-01-24 DIAGNOSIS — Z23 Encounter for immunization: Secondary | ICD-10-CM | POA: Diagnosis not present

## 2021-02-22 DIAGNOSIS — Z23 Encounter for immunization: Secondary | ICD-10-CM | POA: Diagnosis not present

## 2021-02-22 DIAGNOSIS — Z7185 Encounter for immunization safety counseling: Secondary | ICD-10-CM | POA: Diagnosis not present

## 2021-05-07 ENCOUNTER — Other Ambulatory Visit: Payer: Self-pay

## 2021-05-07 ENCOUNTER — Encounter (HOSPITAL_BASED_OUTPATIENT_CLINIC_OR_DEPARTMENT_OTHER): Payer: Self-pay | Admitting: Emergency Medicine

## 2021-05-07 ENCOUNTER — Emergency Department (HOSPITAL_BASED_OUTPATIENT_CLINIC_OR_DEPARTMENT_OTHER): Payer: BC Managed Care – PPO

## 2021-05-07 ENCOUNTER — Observation Stay (HOSPITAL_COMMUNITY): Payer: BC Managed Care – PPO

## 2021-05-07 ENCOUNTER — Observation Stay (HOSPITAL_BASED_OUTPATIENT_CLINIC_OR_DEPARTMENT_OTHER)
Admission: EM | Admit: 2021-05-07 | Discharge: 2021-05-08 | Disposition: A | Discharge status: Home / Self Care | Payer: BC Managed Care – PPO | Attending: Internal Medicine | Admitting: Internal Medicine

## 2021-05-07 DIAGNOSIS — H53131 Sudden visual loss, right eye: Secondary | ICD-10-CM | POA: Diagnosis not present

## 2021-05-07 DIAGNOSIS — Z20822 Contact with and (suspected) exposure to covid-19: Secondary | ICD-10-CM | POA: Insufficient documentation

## 2021-05-07 DIAGNOSIS — G459 Transient cerebral ischemic attack, unspecified: Secondary | ICD-10-CM | POA: Diagnosis not present

## 2021-05-07 DIAGNOSIS — Y900 Blood alcohol level of less than 20 mg/100 ml: Secondary | ICD-10-CM | POA: Insufficient documentation

## 2021-05-07 DIAGNOSIS — Z79899 Other long term (current) drug therapy: Secondary | ICD-10-CM | POA: Diagnosis not present

## 2021-05-07 DIAGNOSIS — B20 Human immunodeficiency virus [HIV] disease: Secondary | ICD-10-CM | POA: Diagnosis present

## 2021-05-07 DIAGNOSIS — G9389 Other specified disorders of brain: Secondary | ICD-10-CM | POA: Diagnosis not present

## 2021-05-07 DIAGNOSIS — I6522 Occlusion and stenosis of left carotid artery: Secondary | ICD-10-CM | POA: Diagnosis not present

## 2021-05-07 DIAGNOSIS — I10 Essential (primary) hypertension: Secondary | ICD-10-CM | POA: Diagnosis not present

## 2021-05-07 DIAGNOSIS — I6381 Other cerebral infarction due to occlusion or stenosis of small artery: Secondary | ICD-10-CM | POA: Diagnosis present

## 2021-05-07 DIAGNOSIS — Z8673 Personal history of transient ischemic attack (TIA), and cerebral infarction without residual deficits: Secondary | ICD-10-CM | POA: Diagnosis not present

## 2021-05-07 DIAGNOSIS — R29818 Other symptoms and signs involving the nervous system: Secondary | ICD-10-CM | POA: Diagnosis not present

## 2021-05-07 LAB — PROTIME-INR
INR: 0.9 (ref 0.8–1.2)
Prothrombin Time: 12.6 seconds (ref 11.4–15.2)

## 2021-05-07 LAB — URINALYSIS, ROUTINE W REFLEX MICROSCOPIC
Bilirubin Urine: NEGATIVE
Glucose, UA: NEGATIVE mg/dL
Hgb urine dipstick: NEGATIVE
Ketones, ur: NEGATIVE mg/dL
Leukocytes,Ua: NEGATIVE
Nitrite: NEGATIVE
Protein, ur: NEGATIVE mg/dL
Specific Gravity, Urine: 1.009 (ref 1.005–1.030)
pH: 5.5 (ref 5.0–8.0)

## 2021-05-07 LAB — DIFFERENTIAL
Abs Immature Granulocytes: 0.09 10*3/uL — ABNORMAL HIGH (ref 0.00–0.07)
Basophils Absolute: 0.1 10*3/uL (ref 0.0–0.1)
Basophils Relative: 1 %
Eosinophils Absolute: 0.3 10*3/uL (ref 0.0–0.5)
Eosinophils Relative: 4 %
Immature Granulocytes: 1 %
Lymphocytes Relative: 30 %
Lymphs Abs: 2.1 10*3/uL (ref 0.7–4.0)
Monocytes Absolute: 0.5 10*3/uL (ref 0.1–1.0)
Monocytes Relative: 7 %
Neutro Abs: 4 10*3/uL (ref 1.7–7.7)
Neutrophils Relative %: 57 %

## 2021-05-07 LAB — COMPREHENSIVE METABOLIC PANEL
ALT: 35 U/L (ref 0–44)
AST: 28 U/L (ref 15–41)
Albumin: 4.8 g/dL (ref 3.5–5.0)
Alkaline Phosphatase: 60 U/L (ref 38–126)
Anion gap: 9 (ref 5–15)
BUN: 22 mg/dL — ABNORMAL HIGH (ref 6–20)
CO2: 26 mmol/L (ref 22–32)
Calcium: 9.4 mg/dL (ref 8.9–10.3)
Chloride: 100 mmol/L (ref 98–111)
Creatinine, Ser: 1.36 mg/dL — ABNORMAL HIGH (ref 0.61–1.24)
GFR, Estimated: >60 mL/min (ref >60)
Glucose, Bld: 90 mg/dL (ref 70–99)
Potassium: 3.8 mmol/L (ref 3.5–5.1)
Sodium: 135 mmol/L (ref 135–145)
Total Bilirubin: 0.7 mg/dL (ref 0.3–1.2)
Total Protein: 7.1 g/dL (ref 6.5–8.1)

## 2021-05-07 LAB — RAPID URINE DRUG SCREEN, HOSP PERFORMED
Amphetamines: NOT DETECTED
Barbiturates: NOT DETECTED
Benzodiazepines: NOT DETECTED
Cocaine: NOT DETECTED
Opiates: NOT DETECTED
Tetrahydrocannabinol: NOT DETECTED

## 2021-05-07 LAB — RESP PANEL BY RT-PCR (FLU A&B, COVID) ARPGX2
Influenza A by PCR: NEGATIVE
Influenza B by PCR: NEGATIVE
SARS Coronavirus 2 by RT PCR: NEGATIVE

## 2021-05-07 LAB — CBC
HCT: 44.6 % (ref 39.0–52.0)
Hemoglobin: 15.5 g/dL (ref 13.0–17.0)
MCH: 34 pg (ref 26.0–34.0)
MCHC: 34.8 g/dL (ref 30.0–36.0)
MCV: 97.8 fL (ref 80.0–100.0)
Platelets: 256 10*3/uL (ref 150–400)
RBC: 4.56 MIL/uL (ref 4.22–5.81)
RDW: 12.5 % (ref 11.5–15.5)
WBC: 7 10*3/uL (ref 4.0–10.5)
nRBC: 0 % (ref 0.0–0.2)

## 2021-05-07 LAB — ETHANOL: Alcohol, Ethyl (B): <10 mg/dL (ref <10)

## 2021-05-07 LAB — APTT: aPTT: 23 seconds — ABNORMAL LOW (ref 24–36)

## 2021-05-07 MED ORDER — ACETAMINOPHEN 650 MG RE SUPP: 650.0000 mg | RECTAL | Status: DC | PRN

## 2021-05-07 MED ORDER — ACETAMINOPHEN 160 MG/5ML PO SOLN: 650.0000 mg | ORAL | Status: DC | PRN

## 2021-05-07 MED ORDER — IOHEXOL 350 MG/ML SOLN
75.0000 mL | Freq: Once | INTRAVENOUS | Status: AC | PRN
  Administered 2021-05-07: 75 mL via INTRAVENOUS

## 2021-05-07 MED ORDER — ENOXAPARIN SODIUM 40 MG/0.4ML IJ SOSY
40.0000 mg | PREFILLED_SYRINGE | Freq: Every day | INTRAMUSCULAR | Status: DC
  Administered 2021-05-07: 40 mg via SUBCUTANEOUS
  Filled 2021-05-07: qty 0.4

## 2021-05-07 MED ORDER — ONDANSETRON HCL 4 MG/2ML IJ SOLN: 4.0000 mg | Freq: Four times a day (QID) | INTRAMUSCULAR | Status: DC | PRN

## 2021-05-07 MED ORDER — STROKE: EARLY STAGES OF RECOVERY BOOK
Freq: Once | Status: AC
  Administered 2021-05-07: 1
  Filled 2021-05-07: qty 1

## 2021-05-07 MED ORDER — CARVEDILOL 6.25 MG PO TABS: 6.2500 mg | ORAL_TABLET | Freq: Two times a day (BID) | ORAL | Status: DC

## 2021-05-07 MED ORDER — GADOBUTROL 1 MMOL/ML IV SOLN
7.0000 mL | Freq: Once | INTRAVENOUS | Status: AC | PRN
  Administered 2021-05-07: 7 mL via INTRAVENOUS

## 2021-05-07 MED ORDER — ACETAMINOPHEN 325 MG PO TABS: 650.0000 mg | ORAL_TABLET | ORAL | Status: DC | PRN

## 2021-05-07 MED ORDER — ACETAMINOPHEN 325 MG PO TABS
650.0000 mg | ORAL_TABLET | Freq: Once | ORAL | Status: AC
  Administered 2021-05-07: 650 mg via ORAL
  Filled 2021-05-07: qty 2

## 2021-05-07 MED ORDER — CYCLOBENZAPRINE HCL 5 MG PO TABS
5.0000 mg | ORAL_TABLET | Freq: Once | ORAL | Status: AC
  Administered 2021-05-07: 5 mg via ORAL
  Filled 2021-05-07: qty 1

## 2021-05-07 MED ORDER — HYDROCHLOROTHIAZIDE 12.5 MG PO TABS: 12.5000 mg | ORAL_TABLET | Freq: Every day | ORAL | Status: DC

## 2021-05-07 MED ORDER — ASPIRIN 325 MG PO TABS
325.0000 mg | ORAL_TABLET | Freq: Every day | ORAL | Status: DC
  Administered 2021-05-07 – 2021-05-08 (×2): 325 mg via ORAL
  Filled 2021-05-07 (×2): qty 1

## 2021-05-07 NOTE — Assessment & Plan Note (Addendum)
Assign to observation telemetry bed. Has been seen by teleneurology consult. MRI brain negative for acute CVA.  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  Pt had made a full neurologic recovery. Pt with only minor blurriness in the far extremes of his right eye visual fields.  Order echo with contrast tomorrow. May need outpatient dilated ocular exam. Check lipid panel. Continue ASA. lovenox for DVT prophylaxis.

## 2021-05-07 NOTE — Consult Note (Addendum)
[facility] Tele[facility]urology Consult Services   Patient Name:   Bradley Chandler, Bradley Chandler Date of Birth:   [birth year] Identification Number:   MRN - [id number] Date of Service:   05/07/2021 07:31:57  Diagnosis:       G45.9 - Transient cerebral ischemic attack, unspecified  Impression:      50 year old woman with past medical history of HIV positive, and hypertension and remote history of migraines who presents with right visual field cut that has resolved last known normal at 6:45 am. NIHSS 0. Suspected TIA. Not candidate for thrombolytic or NIR.  Metrics: Last Known Well: 05/07/2021 06:45:00 [facility] Notification Time: 05/07/2021 07:31:57 Arrival Time: 05/07/2021 07:05:00 Stamp Time: 05/07/2021 07:31:57 Initial Response Time: 05/07/2021 07:36:16 Symptoms: right visual field cut. NIHSS Start Assessment Time: 05/07/2021 07:49:15 Patient is not a candidate for Thrombolytic. Thrombolytic Medical Decision: 05/07/2021 07:47:18 Patient was not deemed candidate for Thrombolytic because of following reasons: Resolved symptoms (no residual disabling symptoms).  CT head showed no acute hemorrhage or acute core infarct. I personally Reviewed the CT Head and it Showed Prior surgery with subjacent encephalomalacia at the right cerebellum. Small chronic appearing infarct at the left internal capsule. No evidence of acute infarct, hemorrhage, hydrocephalus, or mass.  ED Physician notified of diagnostic impression and management plan on 05/07/2021 07:54:30  Advanced Imaging: Advanced Imaging Not Completed because:  resolved symptoms; not candidate for NIR   Our recommendations are outlined below.  Recommendations:        Stroke/Telemetry Floor       Neuro Checks       Bedside Swallow Eval       DVT Prophylaxis       IV Fluids, Normal Saline       Head of Bed 30 Degrees       Euglycemia and Avoid Hyperthermia (PRN Acetaminophen)       Initiate or continue Aspirin 325  MG daily       consider mri brain w/w/o contrast given hx of HIV in addition to mra h/n w/o contrast r/o intra/extracranial stenosis  Routine Consultation with [facility] for Follow up Care  Sign Out:       Discussed with Emergency Department Provider    ------------------------------------------------------------------------------  History of Present Illness: Patient is a 50 year old Male.  Patient was brought by private transportation with symptoms of right visual field cut.  50 year old woman with past medical history of HIV positive, and hypertension and remote history of migraines who presents with right visual field cut that has resolved last known normal at 6:45 am. He is independent at baseline. Denies headache. Drinks ETOH occ.   Past Medical History:      HIV positive, HTN  Social History: Smoking: No Alcohol Use: Yes Drug Use: No  Family History:      non contributory  Review of System:  14 Points Review of Systems was performed and was negative except mentioned in HPI.  No Anticoagulant use   No Antiplatelet use  Allergies:  NKDA    Examination: BP(116/89), Pulse(65), 1A: Level of Consciousness - Alert; keenly responsive + 0 1B: Ask Month and Age - Both Questions Right + 0 1C: Blink Eyes & Squeeze Hands - Performs Both Tasks + 0 2: Test Horizontal Extraocular Movements - Normal + 0 3: Test Visual Fields - No Visual Loss + 0 4: Test Facial Palsy (Use Grimace if Obtunded) - Normal symmetry + 0 5A: Test Left Arm Motor Drift - No Drift for  10 Seconds + 0 5B: Test Right Arm Motor Drift - No Drift for 10 Seconds + 0 6A: Test Left Leg Motor Drift - No Drift for 5 Seconds + 0 6B: Test Right Leg Motor Drift - No Drift for 5 Seconds + 0 7: Test Limb Ataxia (FNF/Heel-Shin) - No Ataxia + 0 8: Test Sensation - Normal; No sensory loss + 0 9: Test Language/Aphasia - Normal; No aphasia + 0 10: Test Dysarthria - Normal + 0 11: Test Extinction/Inattention -  No abnormality + 0  NIHSS Score: 0   Pre-Morbid Modified Rankin Scale: 0 Points = No symptoms at all   Patient/Family was informed the Neurology Consult would occur via TeleHealth consult by way of interactive audio and video telecommunications and consented to receiving care in this manner.   Patient is being evaluated for possible acute neurologic impairment and high probability of imminent or life-threatening deterioration. I spent total of 18 minutes providing care to this patient, including time for face to face visit via telemedicine, review of medical records, imaging studies and discussion of findings with providers, the patient and/or family.   Dr Barron Schmid   [facility] [phone]  Case [id number]

## 2021-05-07 NOTE — Progress Notes (Signed)
Received a phone call from Facility: [facility]  Requesting MD: Ronnald Nian Patient with h/o HTN; GAD/depression; CVA; neuroma s/p craniotomy; and HIV (undetectable VL) presenting with UL vision loss (R).  Concern for TIA.  Symptoms have resolved.  Will get CTA there, needs MRI here.  Neurology will see upon arrival. Plan of care: Transfer for TIA/CVA evaluation at [facility]. The patient will be accepted for observation on telemetry at [facility] when bed is available.    Nursing staff, Please call the [place] number at the top of Amion at the time of the patient's arrival so that the patient can be paged to the admitting physician.   Carlyon Shadow, M.D. [facility]

## 2021-05-07 NOTE — ED Notes (Signed)
Taken to CT at this time. 

## 2021-05-07 NOTE — Plan of Care (Signed)
Seen by tele.  Will be seen by stroke team in the AM. Needs full stroke w/u: -Telemetry monitoring -Allow for permissive hypertension for the first 24-48h - only treat PRN if SBP >220 mmHg. Blood pressures can be gradually normalized to SBP<140 upon discharge. -MRI brain WITH AND WITHOUT contrast-given HIV history -Echocardiogram -HgbA1c, fasting lipid panel -Frequent neuro checks  Please page stroke NP/PA/MD (listed on AMION)  from 8am-4 pm as this patient will be followed by the stroke team at this point.   -- Amie [place], MD Neurologist [facility] Pager: [phone]

## 2021-05-07 NOTE — ED Notes (Signed)
Patient is requesting to leave at this time due to delay in bed placement and unknown when patient will get bed. RN made MD aware. Patient states him waiting here for hours will not get him any closer to an MRI. RN stated understanding and apologized for the delay in obtaining a hospital bed

## 2021-05-07 NOTE — H&P (Addendum)
History and Physical    Rondal Vandevelde YBO:[id number] DOB: April 08, 1971 DOA: 05/07/2021  PCP: Pcp, No   Patient coming from: Home  I have personally briefly reviewed patient's old medical records in [place]  CC: right sided visual field deficits HPI: 50 year old male with a history of hypertension, history of well-controlled HIV disease, anxiety disorder who presents to the ER with sudden onset of right visual field deficits approximately 6:45 AM.  Patient states that he got up about 4:30 AM this morning.  He got to the gym by 5 AM.  He did his usual morning workout.  He also lifted some weights.  He thinks that he may have pulled a muscle in his neck.  When he got home, he took a shower and was ready to get onto his computer for work.  He noticed that around 6:45 AM he suddenly lost vision in his right visual field.  This was the same on his left side.  A right-sided homonymous hemianopsia.  Patient had no other neurologic symptoms.  He denied any muscle weakness.  Denied any speech impediment.  Denied any tingling or paresthesias.  He did not have any trouble walking.  Denied trouble swallowing.  Patient denied chest pain, nausea, vomiting.  He states that he went to the ER around 7 AM.  By approximately 730 to 8:00 this morning, his symptoms started easing off.  Over the course the next several hours his visual defects improved.  Currently at this time he only has some minor blurring in the right sided visual field at the extremes of his visual field.  Patient has been seen by teleneurology.  MRI of the brain was negative for acute stroke.  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  He ended up having a craniotomy.  He has since made a full neurologic recovery.    ED Course: CT head negative for acute CVA. Teleneuro consulted. MRI brian negative for acute CVA. Transferred to [facility] for further workup.  Review of  Systems:  Review of Systems  Constitutional:  Negative for chills, fever and weight loss.  HENT: Negative.  Negative for ear pain, hearing loss and tinnitus.   Eyes:        Right-sided homonymous hemianopsia as described in HPI.  Respiratory: Negative.  Negative for cough, hemoptysis and sputum production.   Cardiovascular: Negative.  Negative for chest pain, palpitations and orthopnea.  Gastrointestinal: Negative.  Negative for heartburn, nausea and vomiting.  Genitourinary: Negative.  Negative for dysuria, frequency and urgency.  Musculoskeletal:  Positive for neck pain. Negative for back pain, joint pain and myalgias.       Neck pain after lifting weights today.  Skin: Negative.   Neurological: Negative.  Negative for dizziness, focal weakness, seizures, loss of consciousness, weakness and headaches.  Psychiatric/Behavioral: Negative.    All other systems reviewed and are negative.  Past Medical History:  Diagnosis Date   Benign essential HTN 10/10/2015   GAD (generalized anxiety disorder) 10/10/2015   History of esophagogastroduodenoscopy (EGD) 10/10/2015   HIV disease ([place]) 10/10/2015   Neuroma    s/p craniotomy   Schatzki's ring 10/10/2015   Seasonal allergies 10/10/2015   Situational depression 10/10/2015   Stroke [facility])     Past Surgical History:  Procedure Laterality Date   BRAIN SURGERY       reports that he has never smoked. He has never used smokeless tobacco. He  reports current alcohol use. He reports that he does not use drugs.  Allergies  Allergen Reactions   Other Other (See Comments)    Dairy-severe abd pain    Family History  Problem Relation Age of Onset   Hypertension Father    Coronary artery disease Father    Stroke Father    Hypertension Paternal Grandfather     Prior to Admission medications   Medication Sig Start Date End Date Taking? Authorizing Provider  buPROPion (WELLBUTRIN XL) 300 MG 24 hr tablet Take 300 mg by mouth. 07/13/15  Yes  [provider]  carvedilol (COREG) 6.25 MG tablet  06/14/15  Yes [provider]  Efavirenz-lamiVUDine-Tenofovir 600-300-300 MG TABS Take 1 tablet by mouth daily as needed. 04/29/21  Yes [provider]  fluticasone Asencion Islam) 50 MCG/ACT nasal spray  07/13/15 05/07/21 Yes [provider]  hydrochlorothiazide (HYDRODIURIL) 12.5 MG tablet Take 12.5 mg by mouth. 03/15/15 05/07/21 Yes [provider]    Physical Exam: Vitals:   05/07/21 1700 05/07/21 1715 05/07/21 1730 05/07/21 1751  BP: (!) 119/92 117/82 (!) 123/95   Pulse: 71 76 69   Resp: 19 20 19    Temp:      TempSrc:      SpO2: 95% 97% 96%   Weight:    70.3 kg  Height:    5\' 6"  (1.676 m)    Physical Exam Vitals and nursing note reviewed.  Constitutional:      General: He is not in acute distress.    Appearance: Normal appearance. He is normal weight. He is not ill-appearing, toxic-appearing or diaphoretic.  HENT:     Head: Normocephalic and atraumatic.     Nose: Nose normal. No rhinorrhea.  Cardiovascular:     Rate and Rhythm: Normal rate and regular rhythm.     Pulses: Normal pulses.     Heart sounds: No murmur heard. Pulmonary:     Effort: Pulmonary effort is normal. No respiratory distress.     Breath sounds: Normal breath sounds. No wheezing or rales.  Abdominal:     General: Abdomen is flat. Bowel sounds are normal. There is no distension.     Tenderness: There is no abdominal tenderness. There is no guarding or rebound.  Musculoskeletal:     Right lower leg: No edema.     Left lower leg: No edema.  Skin:    General: Skin is warm and dry.     Capillary Refill: Capillary refill takes less than 2 seconds.  Neurological:     General: No focal deficit present.     Mental Status: He is alert and oriented to person, place, and time.     Labs on Admission: I have personally reviewed following labs and imaging studies  CBC: Recent Labs  Lab 05/07/21 0720  WBC 7.0   NEUTROABS 4.0  HGB 15.5  HCT 44.6  MCV 97.8  PLT 130   Basic Metabolic Panel: Recent Labs  Lab 05/07/21 0720  NA 135  K 3.8  CL 100  CO2 26  GLUCOSE 90  BUN 22*  CREATININE 1.36*  CALCIUM 9.4   GFR: Estimated Creatinine Clearance: 59.3 mL/min (A) (by C-G formula based on SCr of 1.36 mg/dL (H)). Liver Function Tests: Recent Labs  Lab 05/07/21 0720  AST 28  ALT 35  ALKPHOS 60  BILITOT 0.7  PROT 7.1  ALBUMIN 4.8   No results for input(s): LIPASE, AMYLASE in the last 168 hours. No results for input(s): AMMONIA in the  last 168 hours. Coagulation Profile: Recent Labs  Lab 05/07/21 0720  INR 0.9   Cardiac Enzymes: No results for input(s): CKTOTAL, CKMB, CKMBINDEX, TROPONINI in the last 168 hours. BNP (last 3 results) No results for input(s): PROBNP in the last 8760 hours. HbA1C: No results for input(s): HGBA1C in the last 72 hours. CBG: No results for input(s): GLUCAP in the last 168 hours. Lipid Profile: No results for input(s): CHOL, HDL, LDLCALC, TRIG, CHOLHDL, LDLDIRECT in the last 72 hours. Thyroid Function Tests: No results for input(s): TSH, T4TOTAL, FREET4, T3FREE, THYROIDAB in the last 72 hours. Anemia Panel: No results for input(s): VITAMINB12, FOLATE, FERRITIN, TIBC, IRON, RETICCTPCT in the last 72 hours. Urine analysis:    Component Value Date/Time   COLORURINE COLORLESS (A) 05/07/2021 0739   APPEARANCEUR CLEAR 05/07/2021 0739   LABSPEC 1.009 05/07/2021 0739   PHURINE 5.5 05/07/2021 0739   GLUCOSEU NEGATIVE 05/07/2021 0739   HGBUR NEGATIVE 05/07/2021 0739   BILIRUBINUR NEGATIVE 05/07/2021 0739   KETONESUR NEGATIVE 05/07/2021 0739   PROTEINUR NEGATIVE 05/07/2021 0739   NITRITE NEGATIVE 05/07/2021 0739   LEUKOCYTESUR NEGATIVE 05/07/2021 0739    Radiological Exams on Admission: I have personally reviewed images CT ANGIO HEAD NECK W WO CM  Result Date: 05/07/2021 CLINICAL DATA:  Neuro deficit, acute, stroke suspected stroke EXAM: CT  ANGIOGRAPHY HEAD AND NECK TECHNIQUE: Multidetector CT imaging of the head and neck was performed using the standard protocol during bolus administration of intravenous contrast. Multiplanar CT image reconstructions and MIPs were obtained to evaluate the vascular anatomy. Carotid stenosis measurements (when applicable) are obtained utilizing NASCET criteria, using the distal internal carotid diameter as the denominator. CONTRAST:  88mL OMNIPAQUE IOHEXOL 350 MG/ML SOLN COMPARISON:  Same day CT head. FINDINGS: CTA NECK FINDINGS Aortic arch: Great vessel origins are patent. Right carotid system: No evidence of dissection, stenosis (50% or greater) or occlusion. Left carotid system: No evidence of dissection, stenosis (50% or greater) or occlusion. Mild atherosclerosis at the carotid bifurcation. Vertebral arteries: Mildly left dominant. No evidence of dissection, stenosis (50% or greater) or occlusion. Limited visualization/evaluation of the proximal right vertebral artery due to streak artifact from adjacent pooled dense contrast. Skeleton: Reversal of the normal cervical lordosis. No evidence of acute abnormality Other neck: No acute findings. Upper chest: Visualized lung apices are clear. Review of the MIP images confirms the above findings. CTA HEAD FINDINGS Anterior circulation: Bilateral intracranial ICAs, MCAs, and ACAs are patent without proximal hemodynamically significant stenosis. No aneurysm identified. Posterior circulation: Bilateral intradural vertebral arteries, basilar artery, and posterior cerebral arteries are patent without proximal hemodynamically significant stenosis. No aneurysm identified. Venous sinuses: As permitted by contrast timing, patent. Review of the MIP images confirms the above findings. IMPRESSION: No large vessel occlusion or proximal hemodynamically significant stenosis in the head or neck. Electronically Signed   By: Margaretha Sheffield M.D.   On: 05/07/2021 08:49   MR Brain W and  Wo Contrast  Result Date: 05/07/2021 CLINICAL DATA:  Neuro deficit, stroke suspected, loss site in right eye this morning EXAM: MRI HEAD WITHOUT AND WITH CONTRAST TECHNIQUE: Multiplanar, multiecho pulse sequences of the brain and surrounding structures were obtained without and with intravenous contrast. CONTRAST:  7mL GADAVIST GADOBUTROL 1 MMOL/ML IV SOLN COMPARISON:  7 mL Gadavist. FINDINGS: Brain: No restricted diffusion in the parenchyma to suggest acute or subacute infarct. No parenchymal hemorrhage, mass, mass effect, or midline shift. No hydrocephalus or extra-axial collection. No abnormal parenchymal enhancement. Chronic appearing left basal ganglia lacunar  infarct. Scattered small foci of increased T2 signal in the bilateral frontal lobes (series 7, image 20, 19, 18, and 13, for example). Prior right suboccipital craniotomy with encephalomalacia in the right cerebellar hemisphere. 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 Vascular: Normal flow voids. Skull and upper cervical spine: Prior right suboccipital craniotomy. Normal marrow signal. Sinuses/Orbits: Mucous retention cyst in the right maxillary sinus. Otherwise clear. The orbits are unremarkable. Other: Fluid in the right-greater-than-left mastoid air cells. IMPRESSION: 1. No acute infarct or other intracranial process. No etiology seen for the patient's vision loss. 2. Scattered small foci of increased T2 signal in the bilateral frontal lobes, which are nonspecific and can be seen in the setting of chronic migraines or, given the lacunar infarct in the left internal capsule, these may represent early sequela of small vessel ischemic disease. While T2 hyperintense foci can also be  seen with demyelinating disease, these lesions are not in a pattern consistent with demyelinating disease. 3. 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 Electronically Signed   By: Merilyn Baba M.D.   On: 05/07/2021 21:04   CT HEAD CODE STROKE WO CONTRAST  Result Date: 05/07/2021 CLINICAL DATA:  Code stroke.  Unable to see from right eye. EXAM: CT HEAD WITHOUT CONTRAST TECHNIQUE: Contiguous axial images were obtained from the base of the skull through the vertex without intravenous contrast. COMPARISON:  None. FINDINGS: Brain: Prior surgery with subjacent encephalomalacia at the right cerebellum. Small chronic appearing infarct at the left internal capsule. No evidence of acute infarct, hemorrhage, hydrocephalus, or mass. Vascular: No hyperdense vessel or unexpected calcification. Skull: Normal. Negative for fracture or focal lesion. Sinuses/Orbits: No acute finding. Other: These results were called by telephone at the time of interpretation on 05/07/2021 at 7:38 am to provider ADAM CURATOLO , who verbally acknowledged these results. ASPECTS [facility] Stroke Program Early CT Score) Not scored with this history IMPRESSION: 1. No acute finding. 2. 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 Electronically Signed   By: Jorje Guild M.D.   On: 05/07/2021 07:40    EKG: I have personally reviewed EKG: NSR    Assessment/Plan Principal Problem:   TIA (transient ischemic attack) Active Problems:   Benign essential HTN   HIV disease ([place])    TIA (transient ischemic attack) Assign to observation telemetry bed. Has been seen by teleneurology consult. MRI brain negative for acute CVA.  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  Pt had made a full neurologic recovery. Pt with only minor blurriness in the far extremes of his right  eye visual fields.  Order echo with contrast tomorrow. May need outpatient dilated ocular exam. Check lipid panel. Continue ASA. lovenox for DVT prophylaxis.  Benign essential HTN Hold home blood pressure medications.  Neurology wants the patient to have permissive hypertension for the next 24 hours.  Would not treat unless blood pressures greater than 220.  HIV disease ([place]) Undetectable viral load for numerous years.  DVT prophylaxis: Lovenox Code Status: Full Code Family Communication: discussed with pt and his husband Randall Hiss.  Disposition Plan: return home  Consults called: neurology  Admission status: Observation,  [place], DO [facility] 05/07/2021, 9:40 PM

## 2021-05-07 NOTE — Subjective & Objective (Signed)
CC: right sided visual field deficits HPI: 50 year old male with a history of hypertension, history of well-controlled HIV disease, anxiety disorder who presents to the ER with sudden onset of right visual field deficits approximately 6:45 AM.  Patient states that he got up about 4:30 AM this morning.  He got to the gym by 5 AM.  He did his usual morning workout.  He also lifted some weights.  He thinks that he may have pulled a muscle in his neck.  When he got home, he took a shower and was ready to get onto his computer for work.  He noticed that around 6:45 AM he suddenly lost vision in his right visual field.  This was the same on his left side.  A right-sided homonymous hemianopsia.  Patient had no other neurologic symptoms.  He denied any muscle weakness.  Denied any speech impediment.  Denied any tingling or paresthesias.  He did not have any trouble walking.  Denied trouble swallowing.  Patient denied chest pain, nausea, vomiting.  He states that he went to the ER around 7 AM.  By approximately 730 to 8:00 this morning, his symptoms started easing off.  Over the course the next several hours his visual defects improved.  Currently at this time he only has some minor blurring in the right sided visual field at the extremes of his visual field.  Patient has been seen by teleneurology.  MRI of the brain was negative for acute stroke.  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  He ended up having a craniotomy.  He has since made a full neurologic recovery.

## 2021-05-07 NOTE — ED Notes (Signed)
ED Provider at bedside. 

## 2021-05-07 NOTE — ED Provider Notes (Signed)
[place] EMERGENCY DEPT Provider Note   CSN: [id number] Arrival date & time: 05/07/21  3419  An emergency department physician performed an initial assessment on this suspected stroke patient at 21.  History Chief Complaint  Patient presents with   Code Stroke    Bradley Chandler is a 50 y.o. male.  The history is provided by the patient.  Eye Problem Location:  Both eyes Quality:  Aching Severity:  Mild Onset quality:  Sudden Duration:  30 minutes Timing:  Constant Progression:  Unchanged Chronicity:  New Context comment:  Patient states lost the right side of his vision 30 minutes ago, was working out and went home and when he logged into his computer noticed the right side of vision was gone, could see right side in both eyes. No other symptoms. Relieved by:  Nothing Worsened by:  Nothing Associated symptoms: decreased vision   Associated symptoms: no blurred vision, no crusting, no discharge, no double vision, no facial rash, no headaches, no inflammation, no itching, no nausea, no numbness, no photophobia, no redness, no scotomas, no swelling, no tearing, no tingling, no vomiting and no weakness       Past Medical History:  Diagnosis Date   Benign essential HTN 10/10/2015   GAD (generalized anxiety disorder) 10/10/2015   History of esophagogastroduodenoscopy (EGD) 10/10/2015   HIV disease ([place]) 10/10/2015   HIV positive ([place])    Neuroma 10/10/2015   Schatzki's ring 10/10/2015   Seasonal allergies 10/10/2015   Situational depression 10/10/2015   Stroke [facility])     Patient Active Problem List   Diagnosis Date Noted   HIV disease ([place]) 10/10/2015   Schatzki's ring 10/10/2015   History of esophagogastroduodenoscopy (EGD) 10/10/2015   Neuroma 10/10/2015   GAD (generalized anxiety disorder) 10/10/2015   Benign essential HTN 10/10/2015   Seasonal allergies 10/10/2015   Situational depression 10/10/2015    Past Surgical History:  Procedure Laterality  Date   BRAIN SURGERY         History reviewed. No pertinent family history.  Social History   Tobacco Use   Smoking status: Never   Smokeless tobacco: Never  Substance Use Topics   Alcohol use: Yes    Alcohol/week: 0.0 standard drinks    Comment: 2x month   Drug use: Never    Home Medications Prior to Admission medications   Medication Sig Start Date End Date Taking? Authorizing Provider  bictegravir-emtricitabine-tenofovir AF (BIKTARVY) 50-200-25 MG TABS tablet Take by mouth daily.    [provider]  buPROPion (WELLBUTRIN XL) 300 MG 24 hr tablet Take 300 mg by mouth. 07/13/15   [provider]  carvedilol (COREG) 6.25 MG tablet  06/14/15   [provider]  efavirenz-emtricitabine-tenofovir (ATRIPLA) 600-200-300 MG tablet Take by mouth. 07/27/15 07/26/16  [provider]  fluticasone Asencion Islam) 50 MCG/ACT nasal spray  07/13/15 07/12/16  [provider]  fluticasone Asencion Islam) 50 MCG/ACT nasal spray  07/13/15 07/03/18  [provider]  glucosamine-chondroitin (GLUCOSAMINE-CHONDROITIN DS) 500-400 MG tablet Take by mouth.    [provider]  hydrochlorothiazide (HYDRODIURIL) 12.5 MG tablet Take 12.5 mg by mouth. 03/15/15 07/03/18  [provider]  Multiple Vitamin (MULTIVITAMIN) capsule Take by mouth.    [provider]  Omega-3 1000 MG CAPS Take by mouth.    [provider]  sertraline (ZOLOFT) 50 MG tablet Take 50 mg by mouth. 05/11/15   [provider]    Allergies    Other  Review of Systems   Review  of Systems  Constitutional:  Negative for chills and fever.  HENT:  Negative for ear pain and sore throat.   Eyes:  Positive for visual disturbance. Negative for blurred vision, double vision, photophobia, pain, discharge, redness and itching.  Respiratory:  Negative for cough and shortness of breath.   Cardiovascular:  Negative for chest pain and palpitations.  Gastrointestinal:  Negative  for abdominal pain, nausea and vomiting.  Genitourinary:  Negative for dysuria and hematuria.  Musculoskeletal:  Negative for arthralgias and back pain.  Skin:  Negative for color change and rash.  Neurological:  Negative for dizziness, tingling, tremors, seizures, syncope, facial asymmetry, speech difficulty, weakness, light-headedness, numbness and headaches.  All other systems reviewed and are negative.  Physical Exam Updated Vital Signs BP 116/89 (BP Location: Right Arm)   Pulse 65   Temp 97.8 F (36.6 C) (Oral)   Resp 19   SpO2 98%   Physical Exam Vitals and nursing note reviewed.  Constitutional:      General: He is not in acute distress.    Appearance: He is well-developed.  HENT:     Head: Normocephalic and atraumatic.     Nose: Nose normal.     Mouth/Throat:     Mouth: Mucous membranes are moist.  Eyes:     Extraocular Movements: Extraocular movements intact.     Conjunctiva/sclera: Conjunctivae normal.     Pupils: Pupils are equal, round, and reactive to light.  Cardiovascular:     Rate and Rhythm: Normal rate and regular rhythm.     Heart sounds: No murmur heard. Pulmonary:     Effort: Pulmonary effort is normal. No respiratory distress.     Breath sounds: Normal breath sounds.  Abdominal:     Palpations: Abdomen is soft.     Tenderness: There is no abdominal tenderness.  Musculoskeletal:        General: No swelling.     Cervical back: Neck supple.  Skin:    General: Skin is warm and dry.     Capillary Refill: Capillary refill takes less than 2 seconds.  Neurological:     General: No focal deficit present.     Mental Status: He is alert and oriented to person, place, and time.     Cranial Nerves: No cranial nerve deficit.     Sensory: No sensory deficit.     Motor: No weakness.     Coordination: Coordination normal.     Comments: Right-sided hemianopsia but otherwise 5/5 strength, normal sensation, no drift, normal finger-nose-finger, normal  heel-to-shin, normal speech  Psychiatric:        Mood and Affect: Mood normal.    ED Results / Procedures / Treatments   Labs (all labs ordered are listed, but only abnormal results are displayed) Labs Reviewed  APTT - Abnormal; Notable for the following components:      Result Value   aPTT 23 (*)    All other components within normal limits  DIFFERENTIAL - Abnormal; Notable for the following components:   Abs Immature Granulocytes 0.09 (*)    All other components within normal limits  RESP PANEL BY RT-PCR (FLU A&B, COVID) ARPGX2  PROTIME-INR  CBC  ETHANOL  COMPREHENSIVE METABOLIC PANEL  RAPID URINE DRUG SCREEN, HOSP PERFORMED  URINALYSIS, ROUTINE W REFLEX MICROSCOPIC    EKG EKG Interpretation  Date/Time:  Tuesday May 07 2021 07:22:53 EST Ventricular Rate:  71 PR Interval:  176 QRS Duration: 92 QT Interval:  374 QTC Calculation: 407 R Axis:  20 Text Interpretation: Sinus rhythm Baseline wander in lead(s) V4 Confirmed by Lennice Sites ([id number]) on 05/07/2021 7:27:11 AM  Radiology CT HEAD CODE STROKE WO CONTRAST  Result Date: 05/07/2021 CLINICAL DATA:  Code stroke.  Unable to see from right eye. EXAM: CT HEAD WITHOUT CONTRAST TECHNIQUE: Contiguous axial images were obtained from the base of the skull through the vertex without intravenous contrast. COMPARISON:  None. FINDINGS: Brain: Prior surgery with subjacent encephalomalacia at the right cerebellum. Small chronic appearing infarct at the left internal capsule. No evidence of acute infarct, hemorrhage, hydrocephalus, or mass. Vascular: No hyperdense vessel or unexpected calcification. Skull: Normal. Negative for fracture or focal lesion. Sinuses/Orbits: No acute finding. Other: These results were called by telephone at the time of interpretation on 05/07/2021 at 7:38 am to provider Ivorie Uplinger , who verbally acknowledged these results. ASPECTS [facility] Stroke Program Early CT Score) Not scored with this history  IMPRESSION: 1. No acute finding. 2. 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 Electronically Signed   By: Jorje Guild M.D.   On: 05/07/2021 07:40    Procedures .Critical Care Performed by: Lennice Sites, DO Authorized by: Lennice Sites, DO   Critical care provider statement:    Critical care time (minutes):  35   Critical care was necessary to treat or prevent imminent or life-threatening deterioration of the following conditions:  CNS failure or compromise   Critical care was time spent personally by me on the following activities:  Blood draw for specimens, development of treatment plan with patient or surrogate, discussions with consultants, discussions with primary provider, evaluation of patient's response to treatment, examination of patient, obtaining history from patient or surrogate, ordering and performing treatments and interventions, ordering and review of laboratory studies, ordering and review of radiographic studies, pulse oximetry, re-evaluation of patient's condition and review of old charts   Care discussed with: admitting provider     Medications Ordered in ED Medications  aspirin tablet 325 mg (has no administration in time range)    ED Course  I have reviewed the triage vital signs and the nursing notes.  Pertinent labs & imaging results that were available during my care of the patient were reviewed by me and considered in my medical decision making (see chart for details).    MDM Rules/Calculators/A&P                           Brecken Dewoody is a 50 year old male who presents the ED with right-sided visual field loss.  Normal vitals.  No fever.  History of HIV, neuroma status postcraniotomy, stroke.  Patient about 30 minutes ago noticed that the right side of his visual field was lost.  He was sitting down in his computer and could not see things on the right side of the screen.  He has a  right-sided hemianopsia on exam but otherwise neurologic exam is normal.  No weakness or numbness or speech changes.  Code stroke was initiated.  Head CT showed old lacunar infarct in right cerebellar encephalomalacia but no acute findings.  Dr. Parke Simmers with tele neurology evaluated the patient with me and on reexamination hemianopsia has resolved.  Neuro exam is normal.  NIH scale 0.  tPA no longer needed or needs to be considered as this is likely now TIA.  No large vessel occlusion concern.  EKG shows sinus rhythm.  No ischemic changes.  Lab work is overall  unremarkable.  His last HIV quant is nondetectable.  Last CD4 count normal.  We will touch base with our neurology and [facility], Dr. Malen Gauze and make them aware of the patient being admitted for TIA work-up.  Anticipate stroke work-up including MRIs.  Will admit to medicine.  This chart was dictated using voice recognition software.  Despite best efforts to proofread,  errors can occur which can change the documentation meaning.   Final Clinical Impression(s) / ED Diagnoses Final diagnoses:  TIA (transient ischemic attack)    Rx / DC Orders ED Discharge Orders     None        Lennice Sites, DO 05/07/21 0801

## 2021-05-07 NOTE — Assessment & Plan Note (Addendum)
Hold home blood pressure medications.  Neurology wants the patient to have permissive hypertension for the next 24 hours.  Would not treat unless blood pressures greater than 220.

## 2021-05-07 NOTE — Assessment & Plan Note (Signed)
Undetectable viral load for numerous years.

## 2021-05-07 NOTE — ED Triage Notes (Signed)
Pt arrives to ED with c/o vision loss. Pt reports he has lost vision on the right side in both eyes. This started at 645am. Pt reports he worked out this morning and noticed this when he sat down on the computer to start work.

## 2021-05-08 ENCOUNTER — Observation Stay (HOSPITAL_BASED_OUTPATIENT_CLINIC_OR_DEPARTMENT_OTHER): Payer: BC Managed Care – PPO

## 2021-05-08 DIAGNOSIS — G459 Transient cerebral ischemic attack, unspecified: Secondary | ICD-10-CM | POA: Diagnosis not present

## 2021-05-08 LAB — HEMOGLOBIN A1C
Hgb A1c MFr Bld: 4.4 % — ABNORMAL LOW (ref 4.8–5.6)
Mean Plasma Glucose: 79.58 mg/dL

## 2021-05-08 LAB — ECHOCARDIOGRAM COMPLETE
AR max vel: 2.17 cm2
AV Area VTI: 2.37 cm2
AV Area mean vel: 2.27 cm2
AV Mean grad: 3 mmHg
AV Peak grad: 5.9 mmHg
Ao pk vel: 1.21 m/s
Area-P 1/2: 3.39 cm2
Calc EF: 65.2 %
Height: 66 in
S' Lateral: 2.9 cm
Single Plane A2C EF: 62.8 %
Single Plane A4C EF: 67.6 %
Weight: 2480 oz

## 2021-05-08 LAB — LIPID PANEL
Cholesterol: 190 mg/dL (ref 0–200)
HDL: 37 mg/dL — ABNORMAL LOW (ref >40)
LDL Cholesterol: 81 mg/dL (ref 0–99)
Total CHOL/HDL Ratio: 5.1 RATIO
Triglycerides: 360 mg/dL — ABNORMAL HIGH (ref <150)
VLDL: 72 mg/dL — ABNORMAL HIGH (ref 0–40)

## 2021-05-08 MED ORDER — ASPIRIN EC 81 MG PO TBEC: 81.0000 mg | DELAYED_RELEASE_TABLET | Freq: Every day | ORAL | 0 refills | Status: DC

## 2021-05-08 MED ORDER — ATORVASTATIN CALCIUM 10 MG PO TABS: 10.0000 mg | ORAL_TABLET | Freq: Every day | ORAL | 0 refills | Status: DC

## 2021-05-08 MED ORDER — CLOPIDOGREL BISULFATE 75 MG PO TABS: 75.0000 mg | ORAL_TABLET | Freq: Every day | ORAL | 0 refills | Status: DC

## 2021-05-08 NOTE — Consult Note (Addendum)
Stroke Neurology Consultation Note  Consult Requested by: Dr. Pietro Cassis  Reason for Consult: Right sided homonymous hemianopsia  Consult Date:  05/08/21  The history was obtained from the patient and chart review.  During history and examination, all items were  able to obtain unless otherwise noted.  History of Present Illness:  Bradley Chandler is an 50 y.o. Caucasian male with PMH of HTN, HIV and anxiety disorder who presents with right sided hemianopsia starting at approximately 0645 yesterday.  Patient stated that he awoke at 0430, went to the gym and lifted some weights, returned home and attempted to get onto his computer for work.  At [place], he noticed that he had lost his right visual field in both eyes.  He then proceeded to the ED, and his symptoms began to resolve spontaneously.  Date last known well: Date: 05/07/2021 Time last known well: Time: 06:45 tPA Given: No: symptoms resolving spontaneously MRS:  0 NIHSS:  0  Past Medical History:  Diagnosis Date   Benign essential HTN 10/10/2015   GAD (generalized anxiety disorder) 10/10/2015   History of esophagogastroduodenoscopy (EGD) 10/10/2015   HIV disease ([place]) 10/10/2015   Neuroma    s/p craniotomy   Schatzki's ring 10/10/2015   Seasonal allergies 10/10/2015   Situational depression 10/10/2015   Stroke [facility])      Past Surgical History:  Procedure Laterality Date   BRAIN SURGERY      Family History  Problem Relation Age of Onset   Hypertension Father    Coronary artery disease Father    Stroke Father    Hypertension Paternal Grandfather      Social History:  reports that he has never smoked. He has never used smokeless tobacco. He reports current alcohol use. He reports that he does not use drugs.  Review of Systems: A full ROS was attempted today and was  able to be performed.    Allergies:  Allergies  Allergen Reactions   Other Other (See Comments)    Dairy-severe abd pain     Medications: I have reviewed  the patient's current medications. Prior to Admission:  Medications Prior to Admission  Medication Sig Dispense Refill Last Dose   buPROPion (WELLBUTRIN XL) 300 MG 24 hr tablet Take 300 mg by mouth.   05/07/2021   carvedilol (COREG) 6.25 MG tablet    05/07/2021   Efavirenz-lamiVUDine-Tenofovir 600-300-300 MG TABS Take 1 tablet by mouth daily as needed.   05/06/2021   fluticasone (FLONASE) 50 MCG/ACT nasal spray    05/07/2021   hydrochlorothiazide (HYDRODIURIL) 12.5 MG tablet Take 12.5 mg by mouth.   05/07/2021   Scheduled:  aspirin  325 mg Oral Daily   enoxaparin (LOVENOX) injection  40 mg Subcutaneous QHS   Continuous: DUK:GURKYHCWCBJSE **OR** acetaminophen (TYLENOL) oral liquid 160 mg/5 mL **OR** acetaminophen, ondansetron (ZOFRAN) IV  Test Results: CBC:  Recent Labs  Lab 05/07/21 0720  WBC 7.0  NEUTROABS 4.0  HGB 15.5  HCT 44.6  MCV 97.8  PLT 831   Basic Metabolic Panel:  Recent Labs  Lab 05/07/21 0720  NA 135  K 3.8  CL 100  CO2 26  GLUCOSE 90  BUN 22*  CREATININE 1.36*  CALCIUM 9.4   Liver Function Tests: Recent Labs  Lab 05/07/21 0720  AST 28  ALT 35  ALKPHOS 60  BILITOT 0.7  PROT 7.1  ALBUMIN 4.8   No results for input(s): LIPASE, AMYLASE in the last 168 hours. No results for input(s): AMMONIA in the last 168  hours. Coagulation Studies:  Recent Labs    05/07/21 0720  LABPROT 12.6  INR 0.9   Cardiac Enzymes: No results for input(s): CKTOTAL, CKMB, CKMBINDEX, TROPONINI in the last 168 hours. BNP: Invalid input(s): POCBNP CBG: No results for input(s): GLUCAP in the last 168 hours. Urinalysis:  Recent Labs  Lab 05/07/21 [place] 1.009  PHURINE 5.5  GLUCOSEU NEGATIVE  HGBUR NEGATIVE  BILIRUBINUR NEGATIVE  KETONESUR NEGATIVE  PROTEINUR NEGATIVE  NITRITE NEGATIVE  LEUKOCYTESUR NEGATIVE   Microbiology:  Results for orders placed or performed during the hospital encounter of 05/07/21  Resp Panel by RT-PCR (Flu  A&B, Covid) Nasopharyngeal Swab     Status: None   Collection Time: 05/07/21  7:39 AM   Specimen: Nasopharyngeal Swab; Nasopharyngeal(NP) swabs in vial transport medium  Result Value Ref Range Status   SARS Coronavirus 2 by RT PCR NEGATIVE NEGATIVE Final    Comment: (NOTE) SARS-CoV-2 target nucleic acids are NOT DETECTED.  The SARS-CoV-2 RNA is generally detectable in upper respiratory specimens during the acute phase of infection. The lowest concentration of SARS-CoV-2 viral copies this assay can detect is 138 copies/mL. A negative result does not preclude SARS-Cov-2 infection and should not be used as the sole basis for treatment or other patient management decisions. A negative result may occur with  improper specimen collection/handling, submission of specimen other than nasopharyngeal swab, presence of viral mutation(s) within the areas targeted by this assay, and inadequate number of viral copies(<138 copies/mL). A negative result must be combined with clinical observations, patient history, and epidemiological information. The expected result is Negative.  Fact Sheet for Patients:  EntrepreneurPulse.com.au  Fact Sheet for Healthcare Providers:  IncredibleEmployment.be  This test is no t yet approved or cleared by the [place] FDA and  has been authorized for detection and/or diagnosis of SARS-CoV-2 by FDA under an Emergency Use Authorization (EUA). This EUA will remain  in effect (meaning this test can be used) for the duration of the COVID-19 declaration under Section 564(b)(1) of the Act, 21 U.S.C.section 360bbb-3(b)(1), unless the authorization is terminated  or revoked sooner.       Influenza A by PCR NEGATIVE NEGATIVE Final   Influenza B by PCR NEGATIVE NEGATIVE Final    Comment: (NOTE) The Xpert Xpress SARS-CoV-2/FLU/RSV plus assay is intended as an aid in the diagnosis of influenza from Nasopharyngeal swab specimens  and should not be used as a sole basis for treatment. Nasal washings and aspirates are unacceptable for Xpert Xpress SARS-CoV-2/FLU/RSV testing.  Fact Sheet for Patients: EntrepreneurPulse.com.au  Fact Sheet for Healthcare Providers: IncredibleEmployment.be  This test is not yet approved or cleared by the [place] FDA and has been authorized for detection and/or diagnosis of SARS-CoV-2 by FDA under an Emergency Use Authorization (EUA). This EUA will remain in effect (meaning this test can be used) for the duration of the COVID-19 declaration under Section 564(b)(1) of the Act, 21 U.S.C. section 360bbb-3(b)(1), unless the authorization is terminated or revoked.  Performed at [facility], [address], [place], [place] [address]    Lipid Panel:     Component Value Date/Time   CHOL 190 05/08/2021 0354   TRIG 360 (H) 05/08/2021 0354   HDL 37 (L) 05/08/2021 0354   CHOLHDL 5.1 05/08/2021 0354   VLDL 72 (H) 05/08/2021 0354   LDLCALC 81 05/08/2021 0354   HgbA1c:  Lab Results  Component Value Date   HGBA1C 4.4 (L) 05/08/2021   Urine Drug Screen:  Component Value Date/Time   LABOPIA NONE DETECTED 05/07/2021 0739   COCAINSCRNUR NONE DETECTED 05/07/2021 0739   LABBENZ NONE DETECTED 05/07/2021 0739   AMPHETMU NONE DETECTED 05/07/2021 0739   THCU NONE DETECTED 05/07/2021 0739   LABBARB NONE DETECTED 05/07/2021 0739    Alcohol Level:  Recent Labs  Lab 05/07/21 0720  ETH <10    CT ANGIO HEAD NECK W WO CM  Result Date: 05/07/2021 CLINICAL DATA:  Neuro deficit, acute, stroke suspected stroke EXAM: CT ANGIOGRAPHY HEAD AND NECK TECHNIQUE: Multidetector CT imaging of the head and neck was performed using the standard protocol during bolus administration of intravenous contrast. Multiplanar CT image reconstructions and MIPs were obtained to evaluate the vascular anatomy. Carotid stenosis measurements (when  applicable) are obtained utilizing NASCET criteria, using the distal internal carotid diameter as the denominator. CONTRAST:  26mL OMNIPAQUE IOHEXOL 350 MG/ML SOLN COMPARISON:  Same day CT head. FINDINGS: CTA NECK FINDINGS Aortic arch: Great vessel origins are patent. Right carotid system: No evidence of dissection, stenosis (50% or greater) or occlusion. Left carotid system: No evidence of dissection, stenosis (50% or greater) or occlusion. Mild atherosclerosis at the carotid bifurcation. Vertebral arteries: Mildly left dominant. No evidence of dissection, stenosis (50% or greater) or occlusion. Limited visualization/evaluation of the proximal right vertebral artery due to streak artifact from adjacent pooled dense contrast. Skeleton: Reversal of the normal cervical lordosis. No evidence of acute abnormality Other neck: No acute findings. Upper chest: Visualized lung apices are clear. Review of the MIP images confirms the above findings. CTA HEAD FINDINGS Anterior circulation: Bilateral intracranial ICAs, MCAs, and ACAs are patent without proximal hemodynamically significant stenosis. No aneurysm identified. Posterior circulation: Bilateral intradural vertebral arteries, basilar artery, and posterior cerebral arteries are patent without proximal hemodynamically significant stenosis. No aneurysm identified. Venous sinuses: As permitted by contrast timing, patent. Review of the MIP images confirms the above findings. IMPRESSION: No large vessel occlusion or proximal hemodynamically significant stenosis in the head or neck. Electronically Signed   By: Margaretha Sheffield M.D.   On: 05/07/2021 08:49   MR Brain W and Wo Contrast  Result Date: 05/07/2021 CLINICAL DATA:  Neuro deficit, stroke suspected, loss site in right eye this morning EXAM: MRI HEAD WITHOUT AND WITH CONTRAST TECHNIQUE: Multiplanar, multiecho pulse sequences of the brain and surrounding structures were obtained without and with intravenous contrast.  CONTRAST:  66mL GADAVIST GADOBUTROL 1 MMOL/ML IV SOLN COMPARISON:  7 mL Gadavist. FINDINGS: Brain: No restricted diffusion in the parenchyma to suggest acute or subacute infarct. No parenchymal hemorrhage, mass, mass effect, or midline shift. No hydrocephalus or extra-axial collection. No abnormal parenchymal enhancement. Chronic appearing left basal ganglia lacunar infarct. Scattered small foci of increased T2 signal in the bilateral frontal lobes (series 7, image 20, 19, 18, and 13, for example). Prior right suboccipital craniotomy with encephalomalacia in the right cerebellar hemisphere. 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 Vascular: Normal flow voids. Skull and upper cervical spine: Prior right suboccipital craniotomy. Normal marrow signal. Sinuses/Orbits: Mucous retention cyst in the right maxillary sinus. Otherwise clear. The orbits are unremarkable. Other: Fluid in the right-greater-than-left mastoid air cells. IMPRESSION: 1. No acute infarct or other intracranial process. No etiology seen for the patient's vision loss. 2. Scattered small foci of increased T2 signal  in the bilateral frontal lobes, which are nonspecific and can be seen in the setting of chronic migraines or, given the lacunar infarct in the left internal capsule, these may represent early sequela of small vessel ischemic disease. While T2 hyperintense foci can also be seen with demyelinating disease, these lesions are not in a pattern consistent with demyelinating disease. 3. 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 Electronically Signed   By: Merilyn Baba M.D.   On: 05/07/2021 21:04   CT HEAD  CODE STROKE WO CONTRAST  Result Date: 05/07/2021 CLINICAL DATA:  Code stroke.  Unable to see from right eye. EXAM: CT HEAD WITHOUT CONTRAST TECHNIQUE: Contiguous axial images were obtained from the base of the skull through the vertex without intravenous contrast. COMPARISON:  None. FINDINGS: Brain: Prior surgery with subjacent encephalomalacia at the right cerebellum. Small chronic appearing infarct at the left internal capsule. No evidence of acute infarct, hemorrhage, hydrocephalus, or mass. Vascular: No hyperdense vessel or unexpected calcification. Skull: Normal. Negative for fracture or focal lesion. Sinuses/Orbits: No acute finding. Other: These results were called by telephone at the time of interpretation on 05/07/2021 at 7:38 am to provider ADAM CURATOLO , who verbally acknowledged these results. ASPECTS [facility] Stroke Program Early CT Score) Not scored with this history IMPRESSION: 1. No acute finding. 2. 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 Electronically Signed   By: Jorje Guild M.D.   On: 05/07/2021 07:40     EKG: normal EKG, normal sinus rhythm, unchanged from previous tracings.   Physical Examination: Temp:  [97.7 F (36.5 C)-97.9 F (36.6 C)] 97.7 F (36.5 C) (12/14 0800) Pulse Rate:  [62-78] 62 (12/14 0800) Resp:  [14-20] 14 (12/14 0800) BP: (97-129)/(61-97) 97/76 (12/14 0800) SpO2:  [95 %-98 %] 95 % (12/14 0800) Weight:  [70.3 kg] 70.3 kg (12/13 1751)  General - Well nourished, well developed, in no apparent distress.  Mental Status -  Level of arousal and orientation to time, place, and person were intact. Language including expression, naming, repetition, comprehension was assessed and found intact. Attention span and concentration were normal. Recent and remote memory were intact.   Cranial Nerves II - XII - II - Visual field intact OU. III, IV, VI - Extraocular movements intact. V -  Facial sensation intact bilaterally. VII - Facial movement intact bilaterally. XII - Tongue protrusion intact.  Motor Strength - The patients strength was normal in all extremities and pronator drift was absent.  Bulk was normal and fasciculations were absent.    Sensory - Light touch was assessed and were symmetrical.    Coordination - The patient had normal movements in the hands and feet with no ataxia or dysmetria.  Tremor was absent.  Gait and Station - deferred.   Assessment:  [name] is a 50 y.o. male with history of HTN, HIV and anxiety disorder presenting with right sided homnymous hemianopsia. He did not receive IV t-PA due to his symptoms resolving spontaneously. Patient stated that he awoke at 0430 yesterday morning, went to the gym and lifted some weights, returned home and attempted to get onto his computer for work.  At [place], he noticed that he had lost his right visual field in both eyes.  He then proceeded to the ED, and his symptoms began to resolve spontaneously.  He is currently voicing a strong desire to be discharged and states that he  would like to complete the remainder of his stroke workup as an outpatient.  Discussed benefits of staying in the hospital another night to finish workup and risks of delayed exams.  Patient verbalized understanding and stated that he felt comfortable having remaining tests as an outpatient.  Patient will need bilateral lower extremity ultrasound to assess for DVT, transcranial doppler with bubble study and likely TEE as an outpatient.  TIA vs. Migraine equivalent   Code Stroke CT head No acute stroke.  Chronic lacunar infarct at left internal capsule, right cerebellar encephalomalacia adjacent to a craniotomy  CTA head & neck no LVO or hemodynamically significant stenosis MRI  no acute infarct, scattered small foci of increased T2 signal in bilateral frontal lobes, choroid plexus xanthogranulomas in lateral ventricles 2D Echo  pending Pt refuse to stay, will consider outpatient TCD bubble study and TEE LDL 81 HgbA1c 4.4  SCDs for VTE prophylaxis No antithrombotic prior to admission, now on aspirin 81 mg daily and clopidogrel 75 mg daily for three weeks followed by aspirin alone. Therapy recommendations:  none Disposition:  to home  Hypertension Stable Long-term BP goal normotensive  Hyperlipidemia Home meds:  none, LDL 81, goal < 70 Add atorvastatin 20 mg daily Continue statin at discharge  Other Stroke Risk Factors   Other Active Problems HIV infection Continue home antiretroviral therapy [facility] day # 0   Thank you for this consultation and allowing [place] to participate in the care of this patient.  [place] , MSN, AGACNP-BC [facility] See Amion for schedule and pager information 05/08/2021 1:54 PM   ATTENDING NOTE: I reviewed above note and agree with the assessment and plan. Pt was seen and examined.   50 year old male with history of hypertension, well-controlled HIV, anxiety disorder admitted for acute onset right hemianopia.  Symptoms gradually improved.  By the time to have MRI patient stated that his symptoms almost gone.  CT and MRI negative for acute infarct, but concerning for chronic left ICA lacunar infarct.  CT and MRI also found prior left occipital craniotomy and small right cerebellar encephalomalacia, pt had a prior craniotomy and encephalomalacia due to meningitis when he was a young adult in his early 29s, without residual deficit.  CT head and neck unremarkable.  EF 60 to 65%.  LDL 81, A1c 4.4.  UDS negative.  Creatinine 1.36.  On exam, patient neurologically intact.  Etiology for patient symptoms not quite clear, could be TIA, however he lacks significant stroke risk factors.  However, he does have possible left internal capsule lacunar infarct on CT/MRI, and HIV is another risk factor.  DDx including migraine equivalent.  Patient stated that  he had migraine when he was as a kid.  He refused to stay for TCD bubble study, TEE, LE venous Doppler.  We will have to do them as outpatient.  Given possible TIA, recommend aspirin 81 and Plavix 75 DAPT for 3 weeks and then aspirin alone.  Add low-dose statin 20 given LDL close to goal.  Will arrange for follow-up with Dr. Leonie Man at [facility] in 4 weeks.  For detailed assessment and plan, please refer to above as I have made changes wherever appropriate.   Neurology will sign off. Please call with questions. Pt will follow up with stroke clinic Dr. Leonie Man at [facility] in about 4 weeks. Thanks for the consult.  Rosalin Hawking, MD PhD Stroke Neurology 05/08/2021 5:10 PM   To contact Stroke Continuity provider, please refer to [email or web address]  After hours, contact General Neurology

## 2021-05-08 NOTE — TOC Transition Note (Signed)
Transition of Care [facility]) - CM/SW Discharge Note   Patient Details  Name: Aiyden Lauderback MRN: [id number] Date of Birth: 11-Dec-1970  Transition of Care [facility] CM/SW Contact:  Pollie Friar, RN Phone Number: 05/08/2021, 1:58 PM   Clinical Narrative:    Patient is discharging home with self care. No f/u per PT/OT and no DME needs. Pt denies issues with home medications or transportation.  Pt without a PCP. He asked for an appointment with Dr Nori Riis. She is not taking new patients. CM has updated the patients significant other. Information for Dr Tennis Must [place] placed on AVS and pt will follow up.  Pt has transportation home.    Final next level of care: Home/Self Care Barriers to Discharge: No Barriers Identified   Patient Goals and CMS Choice        Discharge Placement                       Discharge Plan and Services                                     Social Determinants of Health (SDOH) Interventions     Readmission Risk Interventions No flowsheet data found.

## 2021-05-08 NOTE — Evaluation (Signed)
Occupational Therapy Evaluation Patient Details Name: Bradley Chandler MRN: [id number] DOB: 1970-07-06 Today's Date: 05/08/2021   History of Present Illness 50 y.o. M admitted due to R visual Field deficits. CT and MRI are negative, being worked up for a TIA. PMH significant for GAD, HTN, Neuroma, and Schatzki's ring.   Clinical Impression   Pt admitted for concerns listed above. 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 At this time, pt presents back to baseline with no visual, cognitive, or physical concerns. Pt is able to continue completing all ADL's and functional mobility independently. He has no further OT needs at this time and acute OT will sign off.      Recommendations for follow up therapy are one component of a multi-disciplinary discharge planning process, led by the attending physician.  Recommendations may be updated based on patient status, additional functional criteria and insurance authorization.   Follow Up Recommendations  No OT follow up    Assistance Recommended at Discharge None  Functional Status Assessment  Patient has not had a recent decline in their functional status  Equipment Recommendations  None recommended by OT    Recommendations for Other Services       Precautions / Restrictions Precautions Precautions: None Restrictions Weight Bearing Restrictions: No      Mobility Bed Mobility Overal bed mobility: Independent                  Transfers Overall transfer level: Independent Equipment used: None                      Balance Overall balance assessment: Independent                                         ADL either performed or assessed with clinical judgement   ADL Overall ADL's : Independent;At baseline                                       General ADL Comments: No difficulties, pt at his baseline,  independent with no concerns     Vision Baseline Vision/History: 1 Wears glasses Ability to See in Adequate Light: 0 Adequate Patient Visual Report: Peripheral vision impairment Vision Assessment?: Yes Eye Alignment: Within Functional Limits Ocular Range of Motion: Within Functional Limits Alignment/Gaze Preference: Within Defined Limits Tracking/Visual Pursuits: Able to track stimulus in all quads without difficulty Saccades: Within functional limits Convergence: Within functional limits Visual Fields: No apparent deficits Additional Comments: At this time, pt appears to be back to baseline visually.     Perception     Praxis      Pertinent Vitals/Pain Pain Assessment: No/denies pain     Hand Dominance Right   Extremity/Trunk Assessment Upper Extremity Assessment Upper Extremity Assessment: Overall WFL for tasks assessed   Lower Extremity Assessment Lower Extremity Assessment: Overall WFL for tasks assessed   Cervical / Trunk Assessment Cervical / Trunk Assessment: Normal   Communication Communication Communication: No difficulties   Cognition Arousal/Alertness: Awake/alert Behavior During Therapy: WFL for tasks assessed/performed Overall Cognitive Status: Within Functional Limits for tasks assessed  General Comments: Pleasant and agreeable     General Comments  No concerns    Exercises     Shoulder Instructions      Home Living Family/patient expects to be discharged to:: Private residence Living Arrangements: Spouse/significant other Available Help at Discharge: Family Type of Home: House Home Access: Stairs to enter [employer] of Steps: 3 steps   Home Layout: Two level Alternate Level Stairs-Number of Steps: Full flight   Bathroom Shower/Tub: Walk-in shower   Bathroom Toilet: Handicapped height     Home Equipment: None          Prior Functioning/Environment Prior Level of Function  : Independent/Modified Independent;Working/employed;Driving             Mobility Comments: No difficulties ADLs Comments: Indep, works as an Psychologist, counselling Problem List: Impaired vision/perception      OT Treatment/Interventions:      OT Goals(Current goals can be found in the care plan section) Acute Rehab OT Goals Patient Stated Goal: To go home OT Goal Formulation: All assessment and education complete, DC therapy Time For Goal Achievement: 05/08/21 Potential to Achieve Goals: Good  OT Frequency:     Barriers to D/C:            Co-evaluation              AM-PAC OT "6 Clicks" Daily Activity     Outcome Measure Help from another person eating meals?: None Help from another person taking care of personal grooming?: None Help from another person toileting, which includes using toliet, bedpan, or urinal?: None Help from another person bathing (including washing, rinsing, drying)?: None Help from another person to put on and taking off regular upper body clothing?: None Help from another person to put on and taking off regular lower body clothing?: None 6 Click Score: 24   End of Session Nurse Communication: Mobility status  Activity Tolerance: Patient tolerated treatment well Patient left: in bed;with call bell/phone within reach (MD in room)  OT Visit Diagnosis: Low vision, both eyes (H54.2)                Time: 1594-5859 OT Time Calculation (min): 10 min Charges:  OT General Charges $OT Visit: 1 Visit OT Evaluation $OT Eval Low Complexity: 1 Low  Valery Amedee H., OTR/L Acute Rehabilitation  Caddie Randle Elane Mylo Choi 05/08/2021, 9:30 AM

## 2021-05-08 NOTE — Discharge Summary (Addendum)
Physician Discharge Summary  Bradley Chandler FYB:[id number] DOB: 01-30-1971 DOA: 05/07/2021  PCP: Pcp, No  Admit date: 05/07/2021 Discharge date: 05/08/2021  Admitted From: Home Discharge disposition: Home   Code Status: Prior  Diet Recommendation: Cardiac diet  Discharge Diagnosis:   Principal Problem:   TIA (transient ischemic attack) Active Problems:   HIV disease ([place])   Benign essential HTN   History of Present Illness / Brief narrative:  Bradley Chandler is a 50 year old male with a history of hypertension, history of well-controlled HIV disease, anxiety disorder  Bradley Chandler presented to the ED on 12/13 with sudden onset of right visual field deficits approximately 6:45 AM. Bradley Chandler states that he got up about 4:30 AM this morning.  He got to the gym by 5 AM.  He did his usual morning workout including some weightlifting. He thinks that he may have pulled a muscle in his neck.  When he got home, he took a shower and was ready to get onto his computer for work.  He noticed that around 6:45 AM, he suddenly lost vision in his right visual field and hence presented to the ED.  While in the ED, his symptoms resolved in about an hour.  Seen by teleneurology. CT head and MRI of the brain was negative for acute stroke.  Of note, he had meningitis when he was a young adult in his early 57s.  He ended up having a craniotomy and encephalomalacia.  He has since made a full neurologic recovery. Admitted to hospitalist service Neurology consulted  Subjective:  Seen and examined this morning.  Pleasant middle-aged Caucasian male.  Not in distress.  Hospital Course:  TIA (transient ischemic attack) Right homonymous hemianopsia -Present with right visual field cut  -CT head and MRI brain negative for any CVA.   -Symptoms abated in about an hour with only residual blurring of vision which improved as well.   -Admitted for CVA work-up.   -A1c 4.4, lipid panel with HDL 37, LDL 81 -Echocardiogram  showed EF of 60 to 65%, no cardiac source of embolism.. -Per neurology, Bradley Chandler would need ultrasound duplex of lower extremities, transcranial Doppler as well as TEE.  Bradley Chandler is very much requesting to go home to complete the studies as an outpatient.  Bradley Chandler will follow up with neurology clinic as an outpatient. -Per neurology recommendation, Bradley Chandler has been initiated on aspirin and Plavix for 3 weeks followed by aspirin alone.  Also on Lipitor 20 mg daily.  Essential HTN -Continue previous blood pressure medicines.    HIV disease Undetectable viral load for numerous years.  Discharge Medications:   Allergies as of 05/08/2021       Reactions   Other Other (See Comments)   Dairy-severe abd pain        Medication List     TAKE these medications    aspirin EC 81 MG tablet Take 1 tablet (81 mg total) by mouth daily. Swallow whole.   atorvastatin 10 MG tablet Commonly known as: Lipitor Take 1 tablet (10 mg total) by mouth daily.   buPROPion 300 MG 24 hr tablet Commonly known as: WELLBUTRIN XL Take 300 mg by mouth.   carvedilol 6.25 MG tablet Commonly known as: COREG   clopidogrel 75 MG tablet Commonly known as: Plavix Take 1 tablet (75 mg total) by mouth daily for 21 days.   Efavirenz-lamiVUDine-Tenofovir 600-300-300 MG Tabs Take 1 tablet by mouth daily as needed.   fluticasone 50 MCG/ACT nasal spray Commonly known as: FLONASE   hydrochlorothiazide  12.5 MG tablet Commonly known as: HYDRODIURIL Take 12.5 mg by mouth.        Wound care:     Discharge Instructions:   Discharge Instructions     Ambulatory referral to Neurology   Complete by: As directed    Follow up with Dr. Leonie Man at [facility]  in 4-6 weeks. Too complicated for RN to follow. Thanks.   Call MD for:  difficulty breathing, headache or visual disturbances   Complete by: As directed    Call MD for:  extreme fatigue   Complete by: As directed    Call MD for:  hives   Complete by: As directed     Call MD for:  persistant dizziness or light-headedness   Complete by: As directed    Call MD for:  persistant nausea and vomiting   Complete by: As directed    Call MD for:  severe uncontrolled pain   Complete by: As directed    Call MD for:  temperature >100.4   Complete by: As directed    Diet - low sodium heart healthy   Complete by: As directed    Discharge instructions   Complete by: As directed    General discharge instructions:  Follow with Primary MD Pcp, No in 7 days   Get CBC/BMP checked in next visit within 1 week by PCP or SNF MD. (We routinely change or add medications that can affect your baseline labs and fluid status, therefore we recommend that you get the mentioned basic workup next visit with your PCP, your PCP may decide not to get them or add new tests based on their clinical decision)  On your next visit with your PCP, please get your medicines reviewed and adjusted.  Please request your PCP  to go over all hospital tests, procedures, radiology results at the follow up, please get all Hospital records sent to your PCP by signing hospital release before you go home.  Activity: As tolerated with Full fall precautions use walker/cane & assistance as needed  Avoid using any recreational substances like cigarette, tobacco, alcohol, or non-prescribed drug.  If you experience worsening of your admission symptoms, develop shortness of breath, life threatening emergency, suicidal or homicidal thoughts you must seek medical attention immediately by calling 911 or calling your MD immediately  if symptoms less severe.  You must read complete instructions/literature along with all the possible adverse reactions/side effects for all the medicines you take and that have been prescribed to you. Take any new medicine only after you have completely understood and accepted all the possible adverse reactions/side effects.   Do not drive, operate heavy machinery, perform activities  at heights, swimming or participation in water activities or provide baby sitting services if your were admitted for syncope or siezures until you have seen by Primary MD or a Neurologist and advised to do so again.  Do not drive when taking Pain medications.  Do not take more than prescribed Pain, Sleep and Anxiety Medications  Wear Seat belts while driving.  Please note You were cared for by a hospitalist during your hospital stay. If you have any questions about your discharge medications or the care you received while you were in the hospital after you are discharged, you can call the unit and asked to speak with the hospitalist on call if the hospitalist that took care of you is not available. Once you are discharged, your primary care physician will handle any further medical issues. Please note that NO  REFILLS for any discharge medications will be authorized once you are discharged, as it is imperative that you return to your primary care physician (or establish a relationship with a primary care physician if you do not have one) for your aftercare needs so that they can reassess your need for medications and monitor your lab values.   Increase activity slowly   Complete by: As directed        Follow ups:    Follow-up Information     [place], Blondell Reveal, MD. Schedule an appointment as soon as possible for a visit in 2 week(s).   Specialty: Family Medicine Contact information: [place] [address] [phone]         Garvin Fila, MD. Schedule an appointment as soon as possible for a visit in 1 month(s).   Specialties: Neurology, Radiology Why: stroke clinic Contact information: [address] [place] [address] [phone]                 Discharge Exam:   Vitals:   05/07/21 1751 05/07/21 2100 05/08/21 0335 05/08/21 0800  BP:  111/72 106/74 97/76  Pulse:  77 62 62  Resp:  18 19 14   Temp:  97.9 F (36.6 C) 97.8 F  (36.6 C) 97.7 F (36.5 C)  TempSrc:  Oral Oral Oral  SpO2:  96% 98% 95%  Weight: 70.3 kg     Height: 5\' 6"  (1.676 m)       Body mass index is 25.02 kg/m.  General exam: Pleasant, middle-aged Caucasian male.  Not in distress Skin: No rashes, lesions or ulcers. HEENT: Atraumatic, normocephalic, no obvious bleeding Lungs: Clear to auscultation bilaterally CVS: Regular rate and rhythm, no murmur GI/Abd soft, nontender, nondistended, bowel sound present CNS: Alert, awake, oriented x3 Psychiatry: Mood appropriate Extremities: No pedal edema, no calf tenderness  Time coordinating discharge: 35 minutes   The results of significant diagnostics from this hospitalization (including imaging, microbiology, ancillary and laboratory) are listed below for reference.    Procedures and Diagnostic Studies:   CT ANGIO HEAD NECK W WO CM  Result Date: 05/07/2021 CLINICAL DATA:  Neuro deficit, acute, stroke suspected stroke EXAM: CT ANGIOGRAPHY HEAD AND NECK TECHNIQUE: Multidetector CT imaging of the head and neck was performed using the standard protocol during bolus administration of intravenous contrast. Multiplanar CT image reconstructions and MIPs were obtained to evaluate the vascular anatomy. Carotid stenosis measurements (when applicable) are obtained utilizing NASCET criteria, using the distal internal carotid diameter as the denominator. CONTRAST:  19mL OMNIPAQUE IOHEXOL 350 MG/ML SOLN COMPARISON:  Same day CT head. FINDINGS: CTA NECK FINDINGS Aortic arch: Great vessel origins are patent. Right carotid system: No evidence of dissection, stenosis (50% or greater) or occlusion. Left carotid system: No evidence of dissection, stenosis (50% or greater) or occlusion. Mild atherosclerosis at the carotid bifurcation. Vertebral arteries: Mildly left dominant. No evidence of dissection, stenosis (50% or greater) or occlusion. Limited visualization/evaluation of the proximal right vertebral artery due to  streak artifact from adjacent pooled dense contrast. Skeleton: Reversal of the normal cervical lordosis. No evidence of acute abnormality Other neck: No acute findings. Upper chest: Visualized lung apices are clear. Review of the MIP images confirms the above findings. CTA HEAD FINDINGS Anterior circulation: Bilateral intracranial ICAs, MCAs, and ACAs are patent without proximal hemodynamically significant stenosis. No aneurysm identified. Posterior circulation: Bilateral intradural vertebral arteries, basilar artery, and posterior cerebral arteries are patent without proximal hemodynamically significant stenosis. No  aneurysm identified. Venous sinuses: As permitted by contrast timing, patent. Review of the MIP images confirms the above findings. IMPRESSION: No large vessel occlusion or proximal hemodynamically significant stenosis in the head or neck. Electronically Signed   By: Margaretha Sheffield M.D.   On: 05/07/2021 08:49   MR Brain W and Wo Contrast  Result Date: 05/07/2021 CLINICAL DATA:  Neuro deficit, stroke suspected, loss site in right eye this morning EXAM: MRI HEAD WITHOUT AND WITH CONTRAST TECHNIQUE: Multiplanar, multiecho pulse sequences of the brain and surrounding structures were obtained without and with intravenous contrast. CONTRAST:  19mL GADAVIST GADOBUTROL 1 MMOL/ML IV SOLN COMPARISON:  7 mL Gadavist. FINDINGS: Brain: No restricted diffusion in the parenchyma to suggest acute or subacute infarct. No parenchymal hemorrhage, mass, mass effect, or midline shift. No hydrocephalus or extra-axial collection. No abnormal parenchymal enhancement. Chronic appearing left basal ganglia lacunar infarct. Scattered small foci of increased T2 signal in the bilateral frontal lobes (series 7, image 20, 19, 18, and 13, for example). Prior right suboccipital craniotomy with encephalomalacia in the right cerebellar hemisphere. 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 Vascular: Normal flow voids. Skull and upper cervical spine: Prior right suboccipital craniotomy. Normal marrow signal. Sinuses/Orbits: Mucous retention cyst in the right maxillary sinus. Otherwise clear. The orbits are unremarkable. Other: Fluid in the right-greater-than-left mastoid air cells. IMPRESSION: 1. No acute infarct or other intracranial process. No etiology seen for the Bradley Chandler's vision loss. 2. Scattered small foci of increased T2 signal in the bilateral frontal lobes, which are nonspecific and can be seen in the setting of chronic migraines or, given the lacunar infarct in the left internal capsule, these may represent early sequela of small vessel ischemic disease. While T2 hyperintense foci can also be seen with demyelinating disease, these lesions are not in a pattern consistent with demyelinating disease. 3. 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 Electronically Signed   By: Merilyn Baba M.D.   On: 05/07/2021 21:04   CT HEAD CODE STROKE WO CONTRAST  Result Date: 05/07/2021 CLINICAL DATA:  Code stroke.  Unable to see from right eye. EXAM: CT HEAD WITHOUT CONTRAST TECHNIQUE: Contiguous axial images were obtained from the base of the skull through the vertex without intravenous contrast. COMPARISON:  None. FINDINGS: Brain: Prior surgery with subjacent encephalomalacia at the right cerebellum. Small chronic appearing infarct at the left internal capsule. No evidence of acute infarct, hemorrhage, hydrocephalus, or mass. Vascular: No hyperdense vessel or unexpected calcification. Skull: Normal. Negative for fracture or focal lesion. Sinuses/Orbits: No acute finding. Other: These results were  called by telephone at the time of interpretation on 05/07/2021 at 7:38 am to provider ADAM CURATOLO , who verbally acknowledged these results. ASPECTS [facility] Stroke Program Early CT Score) Not scored with this history IMPRESSION: 1. No acute finding. 2. 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 Electronically Signed   By: Jorje Guild M.D.   On: 05/07/2021 07:40     Labs:   Basic Metabolic Panel: Recent Labs  Lab 05/07/21 0720  NA 135  K 3.8  CL 100  CO2 26  GLUCOSE 90  BUN 22*  CREATININE 1.36*  CALCIUM 9.4  GFR Estimated Creatinine Clearance: 59.3 mL/min (A) (by C-G formula based on SCr of 1.36 mg/dL (H)). Liver Function Tests: Recent Labs  Lab 05/07/21 0720  AST 28  ALT 35  ALKPHOS 60  BILITOT 0.7  PROT 7.1  ALBUMIN 4.8   No results for input(s): LIPASE, AMYLASE in the last 168 hours. No results for input(s): AMMONIA in the last 168 hours. Coagulation profile Recent Labs  Lab 05/07/21 0720  INR 0.9    CBC: Recent Labs  Lab 05/07/21 0720  WBC 7.0  NEUTROABS 4.0  HGB 15.5  HCT 44.6  MCV 97.8  PLT 256   Cardiac Enzymes: No results for input(s): CKTOTAL, CKMB, CKMBINDEX, TROPONINI in the last 168 hours. BNP: Invalid input(s): POCBNP CBG: No results for input(s): GLUCAP in the last 168 hours. D-Dimer No results for input(s): DDIMER in the last 72 hours. Hgb A1c Recent Labs    05/08/21 0354  HGBA1C 4.4*   Lipid Profile Recent Labs    05/08/21 0354  CHOL 190  HDL 37*  LDLCALC 81  TRIG 360*  CHOLHDL 5.1   Thyroid function studies No results for input(s): TSH, T4TOTAL, T3FREE, THYROIDAB in the last 72 hours.  Invalid input(s): FREET3 Anemia work up No results for input(s): VITAMINB12, FOLATE, FERRITIN, TIBC, IRON, RETICCTPCT in the last 72 hours. Microbiology Recent Results (from the past 240 hour(s))  Resp Panel by RT-PCR (Flu A&B, Covid) Nasopharyngeal Swab      Status: None   Collection Time: 05/07/21  7:39 AM   Specimen: Nasopharyngeal Swab; Nasopharyngeal(NP) swabs in vial transport medium  Result Value Ref Range Status   SARS Coronavirus 2 by RT PCR NEGATIVE NEGATIVE Final    Comment: (NOTE) SARS-CoV-2 target nucleic acids are NOT DETECTED.  The SARS-CoV-2 RNA is generally detectable in upper respiratory specimens during the acute phase of infection. The lowest concentration of SARS-CoV-2 viral copies this assay can detect is 138 copies/mL. A negative result does not preclude SARS-Cov-2 infection and should not be used as the sole basis for treatment or other Bradley Chandler management decisions. A negative result may occur with  improper specimen collection/handling, submission of specimen other than nasopharyngeal swab, presence of viral mutation(s) within the areas targeted by this assay, and inadequate number of viral copies(<138 copies/mL). A negative result must be combined with clinical observations, Bradley Chandler history, and epidemiological information. The expected result is Negative.  Fact Sheet for Patients:  EntrepreneurPulse.com.au  Fact Sheet for Healthcare Providers:  IncredibleEmployment.be  This test is no t yet approved or cleared by the [place] FDA and  has been authorized for detection and/or diagnosis of SARS-CoV-2 by FDA under an Emergency Use Authorization (EUA). This EUA will remain  in effect (meaning this test can be used) for the duration of the COVID-19 declaration under Section 564(b)(1) of the Act, 21 U.S.C.section 360bbb-3(b)(1), unless the authorization is terminated  or revoked sooner.       Influenza A by PCR NEGATIVE NEGATIVE Final   Influenza B by PCR NEGATIVE NEGATIVE Final    Comment: (NOTE) The Xpert Xpress SARS-CoV-2/FLU/RSV plus assay is intended as an aid in the diagnosis of influenza from Nasopharyngeal swab specimens and should not be used as a sole basis  for treatment. Nasal washings and aspirates are unacceptable for Xpert Xpress SARS-CoV-2/FLU/RSV testing.  Fact Sheet for Patients: EntrepreneurPulse.com.au  Fact Sheet for Healthcare Providers: IncredibleEmployment.be  This test is not yet approved or cleared by the [place] FDA and has been authorized for detection and/or  diagnosis of SARS-CoV-2 by FDA under an Emergency Use Authorization (EUA). This EUA will remain in effect (meaning this test can be used) for the duration of the COVID-19 declaration under Section 564(b)(1) of the Act, 21 U.S.C. section 360bbb-3(b)(1), unless the authorization is terminated or revoked.  Performed at [facility], [address], [place],  [address]      Signed: Terrilee Croak  [facility] 05/09/2021, 3:15 PM

## 2021-05-08 NOTE — Progress Notes (Signed)
PT Cancellation Note  Patient Details Name: Bradley Chandler MRN: [id number] DOB: 01/19/1971   Cancelled Treatment:    Reason Eval/Treat Not Completed: PT screened, no needs identified, will sign off. Discussed pt case with OT who reports that pt is independent with mobility at this time. No acute changes noted on MRI. Will sign off at this time. If needs change, please reconsult.   Thelma Comp 05/08/2021, 11:41 AM  Rolinda Roan, PT, DPT Acute Rehabilitation Services Pager: [phone] Office: [phone]

## 2021-05-08 NOTE — Progress Notes (Signed)
SLP Cancellation Note  Patient Details Name: Dontavis Tschantz MRN: [id number] DOB: 1970-08-10   Cancelled treatment:       Reason Eval/Treat Not Completed: SLP screened, no needs identified, will sign off. Pt was encouraged to notify PCP or return to the ED if issues arise.  Eldean Nanna B. Quentin Ore, [facility], [place] Speech Language Pathologist Office: [phone]  Shonna Chock 05/08/2021, 9:41 AM

## 2021-05-16 ENCOUNTER — Ambulatory Visit (HOSPITAL_BASED_OUTPATIENT_CLINIC_OR_DEPARTMENT_OTHER): Payer: BC Managed Care – PPO | Admitting: Family Medicine

## 2021-05-16 ENCOUNTER — Other Ambulatory Visit: Payer: Self-pay

## 2021-05-16 ENCOUNTER — Encounter (HOSPITAL_BASED_OUTPATIENT_CLINIC_OR_DEPARTMENT_OTHER): Payer: Self-pay | Admitting: Family Medicine

## 2021-05-16 VITALS — BP 108/80 | HR 58 | Ht 66.0 in | Wt 154.0 lb

## 2021-05-16 DIAGNOSIS — R6882 Decreased libido: Secondary | ICD-10-CM | POA: Insufficient documentation

## 2021-05-16 DIAGNOSIS — I10 Essential (primary) hypertension: Secondary | ICD-10-CM

## 2021-05-16 DIAGNOSIS — H53461 Homonymous bilateral field defects, right side: Secondary | ICD-10-CM | POA: Insufficient documentation

## 2021-05-16 DIAGNOSIS — E785 Hyperlipidemia, unspecified: Secondary | ICD-10-CM | POA: Insufficient documentation

## 2021-05-16 DIAGNOSIS — H53469 Homonymous bilateral field defects, unspecified side: Secondary | ICD-10-CM | POA: Diagnosis not present

## 2021-05-16 NOTE — Assessment & Plan Note (Signed)
Blood pressure at goal in office Has been tolerating current regimen well, will continue at this time

## 2021-05-16 NOTE — Assessment & Plan Note (Signed)
Has had prior testosterone checked which was at 250 Will repeat labs today to monitor levels Patient does express an interest in possible testosterone replacement therapy, may consider following results of repeat labs

## 2021-05-16 NOTE — Assessment & Plan Note (Signed)
Noted on prior labs with associated hypertriglyceridemia Recommend lifestyle modifications, patient does workout regularly Continue to monitor in the future, consider use of fibrate

## 2021-05-16 NOTE — Patient Instructions (Signed)
°  Medication Instructions:  Your physician recommends that you continue on your current medications as directed. Please refer to the Current Medication list given to you today. --If you need a refill on any your medications before your next appointment, please call your pharmacy first. If no refills are authorized on file call the office.-- Lab Work: Your physician has recommended that you have lab work today: Testosterone If you have labs (blood work) drawn today and your tests are completely normal, you will receive your results via [facility] a phone call from our staff.  Please ensure you check your voicemail in the event that you authorized detailed messages to be left on a delegated number. If you have any lab test that is abnormal or we need to change your treatment, we will call you to review the results.  Follow-Up: Your next appointment:   Your physician recommends that you schedule a follow-up appointment in: 6 MONTHS with Dr. de [place]  You will receive a text message or e-mail with a link to a survey about your care and experience with [place] today! We would greatly appreciate your feedback!   Thanks for letting us be apart of your health journey!!  Primary Care and Sports Medicine   Dr. Arlina Robes [place]   We encourage you to activate your patient portal called "MyChart".  Sign up information is provided on this After Visit Summary.  MyChart is used to connect with patients for Virtual Visits (Telemedicine).  Patients are able to view lab/test results, encounter notes, upcoming appointments, etc.  Non-urgent messages can be sent to your provider as well. To learn more about what you can do with MyChart, please visit --  [email or web address].

## 2021-05-16 NOTE — Assessment & Plan Note (Signed)
Recent issue for patient which led to hospitalization Did have further work-up recommended, however patient elected to proceed with discharge and outpatient follow-up/testing Does not have follow-up scheduled with neurology, has hesitancy regarding this Recommend follow-up with neurology to complete evaluation Patient will discuss further with his infectious disease specialist and will let us know if he would like referral placed He will continue with aspirin 81 mg, would recommend statin treatment Plavix as per neurology, however patient declines

## 2021-05-16 NOTE — Progress Notes (Signed)
New Patient Office Visit  Subjective:  Patient ID: Bradley Chandler, male    DOB: 12/03/1970  Age: 50 y.o. MRN: [id number]  CC:  Chief Complaint  Patient presents with   Establish Care    Patient has been primarily seeing his ID doctor for primary care - notes in epic   Testosterone    Patient would like to have his testosterone checked. He states he's had it checked with his ID physician and feels it is to low. He has no energy and states he has no drive to work out or be active at [employer].    Referral    Patient is requesting a neurology referral. He is undecided if he would like to see someone here in [place] or elsewhere. Patient states he doesn't feel he needs to start a statin and antiplatelet because it was never "confirmed" that he had a TIA so he would like to discuss it further.    HPI Bradley Chandler is a 50 yo male presenting to establish in clinic.  He has current concerns as outlined above.  Past medical history is significant for HIV disease, hypertension, depression/anxiety.  Vision deficit: Patient recently had hospitalization related to brief episode of partial vision loss.  Patient was admitted for observation, neurology was consulted.  Testing in the hospital included CT head, MRI brain which were both unremarkable.  Echocardiogram was normal.  Further interventions planned included TEE, duplex ultrasound of bilateral lower extremities, TCD bubble study.  It was recommended that he continue with aspirin 81 mg, Plavix, statin at time of discharge.  Plavix was to be taken for 3 weeks and then discontinued.  Patient has been taking aspirin 81 mg, however he has not taken the statin or Plavix.  He indicates that he was unsure of neurologist in the hospital and this led to him not taking the medications as recommended.  At this time, patient is unsure of following up with neurology.  Indicates that he would like to discuss further with his infectious disease specialist prior to  making a decision on following up with neurology as an outpatient.  Decreased libido: Patient reports that he had testosterone level checked recently by infectious disease provider due to reported fatigue, decreased interest/enjoyment related to exercising, decreased libido.  Testosterone was found to be within normal range for laboratory at 250.  Patient reports that his currently reported symptoms have been going on for about 2 to 3 years.  HIV disease: Follows with infectious disease specialist at [facility]  Currently on antiretroviral therapy.  Follows up with specialist regularly.  Indicates that infectious disease specialist has been his main source of primary care for many years.  Depression/anxiety: Reports being diagnosed with this many years ago.  Currently taking Wellbutrin.  Medication was started in [place].  At one point he was on a prior medication but reports having notable weight gain which led to that medications discontinuation.  Has not had any prior counseling or therapy.  Not interested in this at present.  Patient was previously living in [place].  He moved to the area about 5 years ago.  Currently works as an Forensic psychologist.  Outside of work, he used to do triathlons in [place], however since being here he is primarily continued with working out in [employer].  Past Medical History:  Diagnosis Date   Benign essential HTN 10/10/2015   Blood transfusion without reported diagnosis 1997   GAD (generalized anxiety disorder) 10/10/2015   History  of esophagogastroduodenoscopy (EGD) 10/10/2015   HIV disease ([place]) 10/10/2015   Neuroma    s/p craniotomy   Schatzki's ring 10/10/2015   Seasonal allergies 10/10/2015   Situational depression 10/10/2015   Stroke [facility])     Past Surgical History:  Procedure Laterality Date   BRAIN SURGERY     FRACTURE SURGERY  2021   Pinky finger; Rt hand    Family History  Problem Relation Age of Onset   Hypertension Father    Coronary artery disease  Father    Stroke Father    Anxiety disorder Father    Depression Father    Hypertension Paternal Grandfather    Arthritis Mother    Anxiety disorder Maternal Grandmother    Depression Maternal Grandmother     Social History   Socioeconomic History   Marital status: Married    Spouse name: Not on file   Number of children: Not on file   Years of education: Not on file   Highest education level: Not on file  Occupational History   Not on file  Tobacco Use   Smoking status: Former    Packs/day: 0.25    Types: Cigarettes   Smokeless tobacco: Never  Vaping Use   Vaping Use: Never used  Substance and Sexual Activity   Alcohol use: Yes    Alcohol/week: 2.0 standard drinks    Types: 2 Standard drinks or equivalent per week    Comment: 2x month   Drug use: Never   Sexual activity: Not Currently  Other Topics Concern   Not on file  Social History Narrative   Not on file   Social Determinants of Health   Financial Resource Strain: Not on file  Food Insecurity: Not on file  Transportation Needs: Not on file  Physical Activity: Not on file  Stress: Not on file  Social Connections: Not on file  Intimate Partner Violence: Not on file    Objective:   Today's Vitals: BP 108/80    Pulse (!) 58    Ht 5\' 6"  (1.676 m)    Wt 154 lb (69.9 kg)    SpO2 98%    BMI 24.86 kg/m   Physical Exam  50 year old male in no acute distress Cardiovascular exam with regular rate and rhythm, no murmurs appreciated Lungs clear to auscultation bilaterally  Assessment & Plan:   Problem List Items Addressed This Visit       Cardiovascular and Mediastinum   Benign essential HTN - Primary    Blood pressure at goal in office Has been tolerating current regimen well, will continue at this time        Other   Decreased libido    Has had prior testosterone checked which was at 250 Will repeat labs today to monitor levels Patient does express an interest in possible testosterone replacement  therapy, may consider following results of repeat labs      Relevant Orders   Testosterone,Free and Total   Homonymous hemianopia    Recent issue for patient which led to hospitalization Did have further work-up recommended, however patient elected to proceed with discharge and outpatient follow-up/testing Does not have follow-up scheduled with neurology, has hesitancy regarding this Recommend follow-up with neurology to complete evaluation Patient will discuss further with his infectious disease specialist and will let us know if he would like referral placed He will continue with aspirin 81 mg, would recommend statin treatment Plavix as per neurology, however patient declines      Dyslipidemia  Noted on prior labs with associated hypertriglyceridemia Recommend lifestyle modifications, patient does workout regularly Continue to monitor in the future, consider use of fibrate       Outpatient Encounter Medications as of 05/16/2021  Medication Sig   aspirin EC 81 MG tablet Take 1 tablet (81 mg total) by mouth daily. Swallow whole.   buPROPion (WELLBUTRIN XL) 300 MG 24 hr tablet Take 300 mg by mouth.   carvedilol (COREG) 6.25 MG tablet Take 6.25 mg by mouth 2 (two) times daily with a meal.   Efavirenz-lamiVUDine-Tenofovir 600-300-300 MG TABS Take 1 tablet by mouth daily as needed.   hydrochlorothiazide (HYDRODIURIL) 12.5 MG tablet Take 12.5 mg by mouth.   [DISCONTINUED] atorvastatin (LIPITOR) 10 MG tablet Take 1 tablet (10 mg total) by mouth daily. (Patient not taking: Reported on 05/16/2021)   [DISCONTINUED] clopidogrel (PLAVIX) 75 MG tablet Take 1 tablet (75 mg total) by mouth daily for 21 days. (Patient not taking: Reported on 05/16/2021)   [DISCONTINUED] fluticasone (FLONASE) 50 MCG/ACT nasal spray    No facility-administered encounter medications on file as of 05/16/2021.    Follow-up: Return in about 6 months (around 11/14/2021).  Plan for follow-up in about 6 months for  continued monitoring  Bradley Bossi J De [place], MD

## 2021-05-18 LAB — TESTOSTERONE,FREE AND TOTAL
Testosterone, Free: 5.6 pg/mL — ABNORMAL LOW (ref 6.8–21.5)
Testosterone: 275 ng/dL (ref 264–916)

## 2021-05-30 ENCOUNTER — Telehealth (HOSPITAL_BASED_OUTPATIENT_CLINIC_OR_DEPARTMENT_OTHER): Payer: Self-pay

## 2021-05-30 ENCOUNTER — Encounter (HOSPITAL_BASED_OUTPATIENT_CLINIC_OR_DEPARTMENT_OTHER): Payer: Self-pay | Admitting: Family Medicine

## 2021-05-30 DIAGNOSIS — R6882 Decreased libido: Secondary | ICD-10-CM

## 2021-05-30 DIAGNOSIS — E291 Testicular hypofunction: Secondary | ICD-10-CM

## 2021-05-30 NOTE — Telephone Encounter (Signed)
Patient is aware and agreeable to recommendations Orders placed for psa Patient will come to the office tomorrow for labs Education provided on testosterone administration if permitted Patient verbalized understanding.

## 2021-05-31 ENCOUNTER — Ambulatory Visit (HOSPITAL_BASED_OUTPATIENT_CLINIC_OR_DEPARTMENT_OTHER): Payer: BC Managed Care – PPO

## 2021-05-31 ENCOUNTER — Other Ambulatory Visit: Payer: Self-pay

## 2021-05-31 DIAGNOSIS — E291 Testicular hypofunction: Secondary | ICD-10-CM

## 2021-05-31 DIAGNOSIS — R6882 Decreased libido: Secondary | ICD-10-CM

## 2021-06-01 LAB — PSA TOTAL (REFLEX TO FREE): Prostate Specific Ag, Serum: 1.4 ng/mL (ref 0.0–4.0)

## 2021-06-10 ENCOUNTER — Encounter (HOSPITAL_BASED_OUTPATIENT_CLINIC_OR_DEPARTMENT_OTHER): Payer: Self-pay | Admitting: Family Medicine

## 2021-06-10 DIAGNOSIS — E291 Testicular hypofunction: Secondary | ICD-10-CM

## 2021-06-19 MED ORDER — TESTOSTERONE CYPIONATE 200 MG/ML IM SOLN: 100.0000 mg | INTRAMUSCULAR | 0 refills | Status: DC

## 2021-06-26 ENCOUNTER — Telehealth (HOSPITAL_BASED_OUTPATIENT_CLINIC_OR_DEPARTMENT_OTHER): Payer: Self-pay

## 2021-06-26 DIAGNOSIS — E291 Testicular hypofunction: Secondary | ICD-10-CM

## 2021-06-26 NOTE — Telephone Encounter (Signed)
Received denial from insurance company for testosterone Per insurance company patient will need to have 2 sub therapeutic testosterone measurements before testosterone can be approved Per Dr. Tennis Must [place] patient will need to come in and have another testosterone level.

## 2021-06-27 ENCOUNTER — Ambulatory Visit (HOSPITAL_BASED_OUTPATIENT_CLINIC_OR_DEPARTMENT_OTHER): Payer: BC Managed Care – PPO

## 2021-06-27 ENCOUNTER — Other Ambulatory Visit: Payer: Self-pay

## 2021-06-27 DIAGNOSIS — E291 Testicular hypofunction: Secondary | ICD-10-CM | POA: Diagnosis not present

## 2021-07-03 LAB — TESTOSTERONE,FREE AND TOTAL
Testosterone, Free: 2.8 pg/mL — ABNORMAL LOW (ref 6.8–21.5)
Testosterone: 228 ng/dL — ABNORMAL LOW (ref 264–916)

## 2021-07-11 ENCOUNTER — Encounter (HOSPITAL_BASED_OUTPATIENT_CLINIC_OR_DEPARTMENT_OTHER): Payer: Self-pay | Admitting: Family Medicine

## 2021-07-11 DIAGNOSIS — E291 Testicular hypofunction: Secondary | ICD-10-CM

## 2021-07-22 ENCOUNTER — Telehealth (HOSPITAL_BASED_OUTPATIENT_CLINIC_OR_DEPARTMENT_OTHER): Payer: Self-pay | Admitting: Family Medicine

## 2021-07-22 NOTE — Telephone Encounter (Signed)
Received a after hours fax from 2/24 caller AJ was trying to get a PA for patient. Please advise.

## 2021-07-25 ENCOUNTER — Encounter (HOSPITAL_BASED_OUTPATIENT_CLINIC_OR_DEPARTMENT_OTHER): Payer: Self-pay

## 2021-07-25 MED ORDER — TESTOSTERONE CYPIONATE 200 MG/ML IM SOLN: 100.0000 mg | INTRAMUSCULAR | 0 refills | Status: DC

## 2021-08-01 DIAGNOSIS — Z87891 Personal history of nicotine dependence: Secondary | ICD-10-CM | POA: Diagnosis not present

## 2021-08-01 DIAGNOSIS — Z23 Encounter for immunization: Secondary | ICD-10-CM | POA: Diagnosis not present

## 2021-08-01 DIAGNOSIS — I679 Cerebrovascular disease, unspecified: Secondary | ICD-10-CM | POA: Diagnosis not present

## 2021-08-01 DIAGNOSIS — R454 Irritability and anger: Secondary | ICD-10-CM | POA: Diagnosis not present

## 2021-08-01 DIAGNOSIS — Z7185 Encounter for immunization safety counseling: Secondary | ICD-10-CM | POA: Diagnosis not present

## 2021-08-01 DIAGNOSIS — Z13220 Encounter for screening for lipoid disorders: Secondary | ICD-10-CM | POA: Diagnosis not present

## 2021-08-01 DIAGNOSIS — B20 Human immunodeficiency virus [HIV] disease: Secondary | ICD-10-CM | POA: Diagnosis not present

## 2021-08-01 DIAGNOSIS — Z79899 Other long term (current) drug therapy: Secondary | ICD-10-CM | POA: Diagnosis not present

## 2021-08-01 DIAGNOSIS — Z739 Problem related to life management difficulty, unspecified: Secondary | ICD-10-CM | POA: Diagnosis not present

## 2021-08-01 DIAGNOSIS — E785 Hyperlipidemia, unspecified: Secondary | ICD-10-CM | POA: Diagnosis not present

## 2021-08-01 DIAGNOSIS — Z113 Encounter for screening for infections with a predominantly sexual mode of transmission: Secondary | ICD-10-CM | POA: Diagnosis not present

## 2021-08-01 DIAGNOSIS — R7989 Other specified abnormal findings of blood chemistry: Secondary | ICD-10-CM | POA: Diagnosis not present

## 2021-08-01 DIAGNOSIS — I10 Essential (primary) hypertension: Secondary | ICD-10-CM | POA: Diagnosis not present

## 2021-08-01 DIAGNOSIS — Z1321 Encounter for screening for nutritional disorder: Secondary | ICD-10-CM | POA: Diagnosis not present

## 2021-08-26 ENCOUNTER — Encounter (HOSPITAL_BASED_OUTPATIENT_CLINIC_OR_DEPARTMENT_OTHER): Payer: Self-pay | Admitting: Family Medicine

## 2021-08-27 ENCOUNTER — Telehealth (HOSPITAL_BASED_OUTPATIENT_CLINIC_OR_DEPARTMENT_OTHER): Payer: Self-pay

## 2021-08-27 ENCOUNTER — Ambulatory Visit (HOSPITAL_BASED_OUTPATIENT_CLINIC_OR_DEPARTMENT_OTHER): Payer: BC Managed Care – PPO

## 2021-08-27 DIAGNOSIS — E291 Testicular hypofunction: Secondary | ICD-10-CM | POA: Diagnosis not present

## 2021-08-29 LAB — TESTOSTERONE,FREE AND TOTAL
Testosterone, Free: 4.5 pg/mL — ABNORMAL LOW (ref 6.8–21.5)
Testosterone: 234 ng/dL — ABNORMAL LOW (ref 264–916)

## 2021-09-09 ENCOUNTER — Telehealth (HOSPITAL_BASED_OUTPATIENT_CLINIC_OR_DEPARTMENT_OTHER): Payer: Self-pay | Admitting: Family Medicine

## 2021-09-09 NOTE — Telephone Encounter (Signed)
Patient states that he is having severe groin pain and has a small nodule in the groin area.  ?Patient offered the first available appointment since Dr. De [place] is full. Patient was upset and feels he needs to be seen sooner than tomorrow morning. I called the patient and advised that he should go to the ED if he feels he is unable to wait for the appointment. Patient verbalized understanding but declined to go to ED and states he will wait to see Dr. De [place] tomorrow morning. AS, CMA ?

## 2021-09-10 ENCOUNTER — Ambulatory Visit (HOSPITAL_BASED_OUTPATIENT_CLINIC_OR_DEPARTMENT_OTHER): Payer: BC Managed Care – PPO | Admitting: Family Medicine

## 2021-09-10 ENCOUNTER — Encounter (HOSPITAL_BASED_OUTPATIENT_CLINIC_OR_DEPARTMENT_OTHER): Payer: Self-pay | Admitting: Family Medicine

## 2021-09-10 VITALS — BP 96/81 | HR 87 | Temp 97.7°F | Ht 66.0 in | Wt 159.9 lb

## 2021-09-10 DIAGNOSIS — E291 Testicular hypofunction: Secondary | ICD-10-CM | POA: Diagnosis not present

## 2021-09-10 DIAGNOSIS — B20 Human immunodeficiency virus [HIV] disease: Secondary | ICD-10-CM

## 2021-09-10 DIAGNOSIS — R7989 Other specified abnormal findings of blood chemistry: Secondary | ICD-10-CM | POA: Insufficient documentation

## 2021-09-10 DIAGNOSIS — L0390 Cellulitis, unspecified: Secondary | ICD-10-CM | POA: Diagnosis not present

## 2021-09-10 MED ORDER — SULFAMETHOXAZOLE-TRIMETHOPRIM 800-160 MG PO TABS: 1.0000 | ORAL_TABLET | Freq: Two times a day (BID) | ORAL | 0 refills | Status: DC

## 2021-09-10 MED ORDER — HYDROCODONE-ACETAMINOPHEN 5-325 MG PO TABS: 1.0000 | ORAL_TABLET | Freq: Three times a day (TID) | ORAL | 0 refills | Status: DC | PRN

## 2021-09-10 MED ORDER — TESTOSTERONE CYPIONATE 200 MG/ML IM SOLN: 100.0000 mg | INTRAMUSCULAR | 0 refills | Status: DC

## 2021-09-10 NOTE — Assessment & Plan Note (Signed)
Patient has been having issue with getting testosterone cypionate approved through insurance.  He did need multiple readings of low testosterone level, however his initial reading was just above lower limit of normal and this may have led to some hindrance with receiving approval.  He has had another recent testosterone level drawn which was below normal limits as per laboratory.  He is requesting to have new prescription sent to pharmacy to work on authorization again, prescription sent ?

## 2021-09-10 NOTE — Assessment & Plan Note (Signed)
Continues to follow with infectious disease.  He had recent HIV RNA quant lab completed, however reports that they were having some issue with their lab testing and is requesting to have this repeated today, order placed ?

## 2021-09-10 NOTE — Patient Instructions (Signed)

## 2021-09-10 NOTE — Progress Notes (Signed)
? ? ?  Procedures performed today:   ? ?None. ? ?Independent interpretation of notes and tests performed by another provider:  ? ?None. ? ?Brief History, Exam, Impression, and Recommendations:   ? ?BP 96/81   Pulse 87   Temp 97.7 ?F (36.5 ?C)   Ht '5\' 6"'$  (1.676 m)   Wt 159 lb 14.4 oz (72.5 kg)   SpO2 98%   BMI 25.81 kg/m?  ? ?Low testosterone ?Patient has been having issue with getting testosterone cypionate approved through insurance.  He did need multiple readings of low testosterone level, however his initial reading was just above lower limit of normal and this may have led to some hindrance with receiving approval.  He has had another recent testosterone level drawn which was below normal limits as per laboratory.  He is requesting to have new prescription sent to pharmacy to work on authorization again, prescription sent ? ?HIV disease ([place]) ?Continues to follow with infectious disease.  He had recent HIV RNA quant lab completed, however reports that they were having some issue with their lab testing and is requesting to have this repeated today, order placed ? ?Cellulitis ?Patient reports that 3 days ago he developed an area of pain and swelling over his left groin area.  Since it was first noticed, pain and swelling has been progressing.  He denies any prior similar issues.  He is wondering if the area could be a swollen lymph node.  He has not had any genital involvement.  Denies any hematuria, dysuria.  Has not had any change in bowel movements, no blood in the stool, no diarrhea or constipation.  Denies systemic symptoms such as fever, chills, sweats.  Will occasionally have some nausea due to the degree of pain. ?On exam, patient has area of erythema, induration, significant tenderness to palpation in the left inguinal region.  Does not have any redness extending down to genitalia.  There is no scrotal swelling.  No obvious area of fluctuance noted, however examination is somewhat difficult due to  patient's tenderness.  He is afebrile and in no acute distress in the office today. ?Area appears most consistent with superficial soft tissue infection, discussed that findings are most consistent with infection and not expected to be related to a lymph node.  We will proceed with Bactrim for treatment of infection; provided short course of Norco to help control pain ?Given location of infection, history of HIV infection, degree of tenderness that he is having, I discussed recommendation of evaluation with urologist.  Concern is for potential spreading of infection whether systemically or more invasively locally.  Patient was somewhat reluctant regarding this, however did agree to referral ?Discussed that with initiation of antibiotics, would expect improvement in symptoms over the next 24 hours or so ?If symptoms are progressing such as worsening pain, developing systemic symptoms such as fever, chills, sweats, would strongly recommend presenting to emergency department for further evaluation as it is possible that infection is spreading/worsening ? ?Plan for follow-up in about 2 months, this would likely be related to following up on testosterone therapy if patient is able to initiate ? ? ?___________________________________________ ?Hence Derrick de [place], MD, ABFM, CAQSM ?Primary Care and Sports Medicine ?[place] ?

## 2021-09-10 NOTE — Assessment & Plan Note (Addendum)
Patient reports that 3 days ago he developed an area of pain and swelling over his left groin area.  Since it was first noticed, pain and swelling has been progressing.  He denies any prior similar issues.  He is wondering if the area could be a swollen lymph node.  He has not had any genital involvement.  Denies any hematuria, dysuria.  Has not had any change in bowel movements, no blood in the stool, no diarrhea or constipation.  Denies systemic symptoms such as fever, chills, sweats.  Will occasionally have some nausea due to the degree of pain. ?On exam, patient has area of erythema, induration, significant tenderness to palpation in the left inguinal region.  Does not have any redness extending down to genitalia.  There is no scrotal swelling.  No obvious area of fluctuance noted, however examination is somewhat difficult due to patient's tenderness.  He is afebrile and in no acute distress in the office today. ?Area appears most consistent with superficial soft tissue infection, discussed that findings are most consistent with infection and not expected to be related to a lymph node.  We will proceed with Bactrim for treatment of infection; provided short course of Norco to help control pain ?Given location of infection, history of HIV infection, degree of tenderness that he is having, I discussed recommendation of evaluation with urologist.  Concern is for potential spreading of infection whether systemically or more invasively locally.  Patient was somewhat reluctant regarding this, however did agree to referral ?Discussed that with initiation of antibiotics, would expect improvement in symptoms over the next 24 hours or so ?If symptoms are progressing such as worsening pain, developing systemic symptoms such as fever, chills, sweats, would strongly recommend presenting to emergency department for further evaluation as it is possible that infection is spreading/worsening ?

## 2021-09-11 LAB — HIV-1 RNA QUANT-NO REFLEX-BLD: HIV-1 RNA Viral Load: <20 copies/mL

## 2021-09-16 DIAGNOSIS — L02214 Cutaneous abscess of groin: Secondary | ICD-10-CM | POA: Diagnosis not present

## 2021-09-19 ENCOUNTER — Telehealth (HOSPITAL_BASED_OUTPATIENT_CLINIC_OR_DEPARTMENT_OTHER): Payer: Self-pay | Admitting: Family Medicine

## 2021-09-19 NOTE — Telephone Encounter (Signed)
Received fax transmission from pt's ins company denying coverage for Testosterone Cypionate injection. Documents will be in provider's yellow dot tray. ?

## 2021-09-24 NOTE — Telephone Encounter (Signed)
Received fax transmission form pt's ins company approving coverage for testosterone cypionate '200mg'$ /ml. Documents will be in provider's yellow dot tray. ?

## 2021-09-25 NOTE — Telephone Encounter (Signed)
I apologize Dr. De [place] wrong patient for sure. I will send over there right patient. I'm sorry.

## 2021-09-25 NOTE — Telephone Encounter (Signed)
Pt called and stated his insurance was able to approve humalog and humulin for him. He would like to get this sent over to the pharmacy if possible. He stated that the basaglar he was on wasn't working quite working for him. So he wanted to see if he could switch medication with humalog or humulin. ? ? ?Thanks, ? ?Lowella Bandy, CMA ?

## 2021-09-26 DIAGNOSIS — F419 Anxiety disorder, unspecified: Secondary | ICD-10-CM | POA: Diagnosis not present

## 2021-09-26 DIAGNOSIS — B20 Human immunodeficiency virus [HIV] disease: Secondary | ICD-10-CM | POA: Diagnosis not present

## 2021-09-26 DIAGNOSIS — F439 Reaction to severe stress, unspecified: Secondary | ICD-10-CM | POA: Diagnosis not present

## 2021-09-26 DIAGNOSIS — Z739 Problem related to life management difficulty, unspecified: Secondary | ICD-10-CM | POA: Diagnosis not present

## 2021-10-01 DIAGNOSIS — Z79899 Other long term (current) drug therapy: Secondary | ICD-10-CM | POA: Insufficient documentation

## 2021-10-01 DIAGNOSIS — D708 Other neutropenia: Secondary | ICD-10-CM | POA: Diagnosis not present

## 2021-10-01 DIAGNOSIS — L02214 Cutaneous abscess of groin: Secondary | ICD-10-CM | POA: Diagnosis not present

## 2021-10-01 DIAGNOSIS — Z739 Problem related to life management difficulty, unspecified: Secondary | ICD-10-CM | POA: Insufficient documentation

## 2021-10-01 DIAGNOSIS — F439 Reaction to severe stress, unspecified: Secondary | ICD-10-CM | POA: Insufficient documentation

## 2021-10-08 ENCOUNTER — Other Ambulatory Visit: Payer: Self-pay | Admitting: Urology

## 2021-10-08 DIAGNOSIS — L02214 Cutaneous abscess of groin: Secondary | ICD-10-CM | POA: Diagnosis not present

## 2021-10-22 ENCOUNTER — Encounter (HOSPITAL_BASED_OUTPATIENT_CLINIC_OR_DEPARTMENT_OTHER): Payer: Self-pay | Admitting: Urology

## 2021-10-22 ENCOUNTER — Other Ambulatory Visit: Payer: Self-pay

## 2021-10-22 ENCOUNTER — Ambulatory Visit (HOSPITAL_BASED_OUTPATIENT_CLINIC_OR_DEPARTMENT_OTHER): Payer: BC Managed Care – PPO | Admitting: Family Medicine

## 2021-10-22 NOTE — Progress Notes (Signed)
Spoke w/ via phone for pre-op interview---pt  Lab needs dos----I stat               Lab results------EKG in epic 05/07/2022 COVID test -----patient states asymptomatic no test needed Arrive at -------0530 NPO after MN NO Solid Food.  Clear liquids from MN until---0430 Med rec completed Medications to take morning of surgery -----cymbalta, wellbutrin X, coreg and efavirenz-iamivudine-Tenofovir Diabetic medication -----n/a Patient instructed no nail polish to be worn day of surgery Patient instructed to bring photo id and insurance card day of surgery Patient aware to have Driver (ride ) / caregiver  [name]  for 24 hours after surgery  Patient Special Instructions -----n/a Pre-Op special Istructions -----n/a Patient verbalized understanding of instructions that were given at this phone interview. Patient denies shortness of breath, chest pain, fever, cough at this phone interview.

## 2021-10-25 ENCOUNTER — Ambulatory Visit (HOSPITAL_BASED_OUTPATIENT_CLINIC_OR_DEPARTMENT_OTHER): Admission: RE | Admit: 2021-10-25 | Payer: BC Managed Care – PPO | Source: Home / Self Care | Admitting: Urology

## 2021-10-25 DIAGNOSIS — I10 Essential (primary) hypertension: Secondary | ICD-10-CM

## 2021-10-25 HISTORY — DX: Personal history of urinary calculi: Z87.442

## 2021-10-25 SURGERY — INCISION AND DRAINAGE, ABSCESS
Anesthesia: General | Laterality: Left

## 2021-11-14 ENCOUNTER — Ambulatory Visit (HOSPITAL_BASED_OUTPATIENT_CLINIC_OR_DEPARTMENT_OTHER): Payer: BC Managed Care – PPO | Admitting: Family Medicine

## 2021-12-05 DIAGNOSIS — F439 Reaction to severe stress, unspecified: Secondary | ICD-10-CM | POA: Diagnosis not present

## 2021-12-05 DIAGNOSIS — F419 Anxiety disorder, unspecified: Secondary | ICD-10-CM | POA: Diagnosis not present

## 2021-12-05 DIAGNOSIS — G4700 Insomnia, unspecified: Secondary | ICD-10-CM | POA: Diagnosis not present

## 2022-01-02 DIAGNOSIS — B20 Human immunodeficiency virus [HIV] disease: Secondary | ICD-10-CM | POA: Diagnosis not present

## 2022-01-02 DIAGNOSIS — Z79899 Other long term (current) drug therapy: Secondary | ICD-10-CM | POA: Diagnosis not present

## 2022-01-02 DIAGNOSIS — Z5181 Encounter for therapeutic drug level monitoring: Secondary | ICD-10-CM | POA: Diagnosis not present

## 2022-01-02 DIAGNOSIS — Z9189 Other specified personal risk factors, not elsewhere classified: Secondary | ICD-10-CM | POA: Diagnosis not present

## 2022-01-02 DIAGNOSIS — Z23 Encounter for immunization: Secondary | ICD-10-CM | POA: Diagnosis not present

## 2022-01-02 DIAGNOSIS — Z13220 Encounter for screening for lipoid disorders: Secondary | ICD-10-CM | POA: Diagnosis not present

## 2022-01-16 ENCOUNTER — Encounter (HOSPITAL_BASED_OUTPATIENT_CLINIC_OR_DEPARTMENT_OTHER): Payer: Self-pay | Admitting: Family Medicine

## 2022-01-16 ENCOUNTER — Ambulatory Visit (HOSPITAL_BASED_OUTPATIENT_CLINIC_OR_DEPARTMENT_OTHER): Payer: BC Managed Care – PPO | Admitting: Family Medicine

## 2022-01-16 DIAGNOSIS — E291 Testicular hypofunction: Secondary | ICD-10-CM | POA: Insufficient documentation

## 2022-01-16 MED ORDER — SHARPS CONTAINER MISC: 1.0000 | 1 refills | Status: DC | PRN

## 2022-01-16 MED ORDER — TESTOSTERONE CYPIONATE 200 MG/ML IM SOLN: 100.0000 mg | INTRAMUSCULAR | 0 refills | Status: DC

## 2022-01-16 MED ORDER — "NEEDLE (DISP) 22G X 1-1/2"" MISC": 1.0000 | 2 refills | Status: DC

## 2022-01-16 MED ORDER — SYRINGE (DISPOSABLE) 1 ML MISC: 1.0000 | 2 refills | Status: DC

## 2022-01-16 NOTE — Progress Notes (Signed)
    Procedures performed today:    None.  Independent interpretation of notes and tests performed by another provider:   None.  Brief History, Exam, Impression, and Recommendations:    BP 112/84   Pulse 79   Ht '5\' 6"'$  (1.676 m)   Wt 153 lb 6.4 oz (69.6 kg)   SpO2 100%   BMI 24.76 kg/m   Hypogonadism in male Since last appointment, patient has been able to begin testosterone therapy.  He has been administering medication on Fridays.  Unfortunately, it does not seem that he has been administering 200 mg dose instead of the 100 mg dose prescribed.  Reports that he did not realize he was supposed to be using 0.5 mL and has been using before 1 mL vial that he has been receiving.  Generally he has been doing well since beginning the medication, feels that symptomatically he is doing much better and feels overall great since starting the testosterone. He started testosterone about 6 weeks ago and thus would be needing to arrange for laboratory evaluation to assess response to testosterone therapy After discussion, recommend returning to 100 mg weekly dose due to potential for 2 however testosterone level and potential adverse effects We will plan for follow-up in about 2 months to assess progress, he will return for nurse visit on a Tuesday to have lab drawn at about the midway point between doses.  We will also check a PSA at that time.  He did have recent CBC and lipid panel completed with specialist, these were reviewed in the chart, no concerns with those  Return in about 2 months (around 03/18/2022) for Testosterone follow-up; NV for labs on Tuesday before next appt.   ___________________________________________ Marlowe Cinquemani de [place], MD, ABFM, CAQSM Primary Care and [place]

## 2022-01-16 NOTE — Assessment & Plan Note (Signed)
Since last appointment, patient has been able to begin testosterone therapy.  He has been administering medication on Fridays.  Unfortunately, it does not seem that he has been administering 200 mg dose instead of the 100 mg dose prescribed.  Reports that he did not realize he was supposed to be using 0.5 mL and has been using before 1 mL vial that he has been receiving.  Generally he has been doing well since beginning the medication, feels that symptomatically he is doing much better and feels overall great since starting the testosterone. He started testosterone about 6 weeks ago and thus would be needing to arrange for laboratory evaluation to assess response to testosterone therapy After discussion, recommend returning to 100 mg weekly dose due to potential for 2 however testosterone level and potential adverse effects We will plan for follow-up in about 2 months to assess progress, he will return for nurse visit on a Tuesday to have lab drawn at about the midway point between doses.  We will also check a PSA at that time.  He did have recent CBC and lipid panel completed with specialist, these were reviewed in the chart, no concerns with those

## 2022-01-16 NOTE — Patient Instructions (Signed)
  Medication Instructions:  Your physician recommends that you continue on your current medications as directed. Please refer to the Current Medication list given to you today. --If you need a refill on any your medications before your next appointment, please call your pharmacy first. If no refills are authorized on file call the office.-- Lab Work: Your physician has recommended that you have lab work today: No If you have labs (blood work) drawn today and your tests are completely normal, you will receive your results via [place] a phone call from our staff.  Please ensure you check your voicemail in the event that you authorized detailed messages to be left on a delegated number. If you have any lab test that is abnormal or we need to change your treatment, we will call you to review the results.  Referrals/Procedures/Imaging: No  Follow-Up: Your next appointment:   Your physician recommends that you schedule a follow-up appointment in: 2 month follow-up for testosterone with Dr. de [place]. Nurse visit a couple days before for labs.  You will receive a text message or e-mail with a link to a survey about your care and experience with [place] today! We would greatly appreciate your feedback!   Thanks for letting us be apart of your health journey!!  Primary Care and Sports Medicine   Dr. Arlina Robes [place]   We encourage you to activate your patient portal called "MyChart".  Sign up information is provided on this After Visit Summary.  MyChart is used to connect with patients for Virtual Visits (Telemedicine).  Patients are able to view lab/test results, encounter notes, upcoming appointments, etc.  Non-urgent messages can be sent to your provider as well. To learn more about what you can do with MyChart, please visit --  [email or web address].

## 2022-01-31 DIAGNOSIS — L02214 Cutaneous abscess of groin: Secondary | ICD-10-CM | POA: Diagnosis not present

## 2022-02-04 ENCOUNTER — Other Ambulatory Visit: Payer: Self-pay | Admitting: Urology

## 2022-02-04 ENCOUNTER — Encounter (HOSPITAL_BASED_OUTPATIENT_CLINIC_OR_DEPARTMENT_OTHER): Payer: Self-pay | Admitting: Urology

## 2022-02-04 NOTE — Progress Notes (Signed)
Spoke w/ via phone for pre-op interview--- Jenny Reichmann Lab needs dos---- ISTAT              Lab results------ Current EKG in Epic dated 04/2022. COVID test -----patient states asymptomatic no test needed Arrive at -------0530 NPO after MN NO Solid Food.   Med rec completed Medications to take morning of surgery ----- Coreg, Wellbutrin and Tenofvir Diabetic medication ----- Patient instructed no nail polish to be worn day of surgery Patient instructed to bring photo id and insurance card day of surgery Patient aware to have Driver (ride ) / caregiver  Spouse Virgina Organ  for 24 hours after surgery  Patient Special Instructions ----- Pre-Op special Istructions ----- Patient verbalized understanding of instructions that were given at this phone interview. Patient denies shortness of breath, chest pain, fever, cough at this phone interview.

## 2022-02-13 DIAGNOSIS — G4700 Insomnia, unspecified: Secondary | ICD-10-CM | POA: Diagnosis not present

## 2022-02-17 ENCOUNTER — Encounter (HOSPITAL_BASED_OUTPATIENT_CLINIC_OR_DEPARTMENT_OTHER): Payer: Self-pay | Admitting: Nurse Practitioner

## 2022-02-17 ENCOUNTER — Ambulatory Visit (INDEPENDENT_AMBULATORY_CARE_PROVIDER_SITE_OTHER): Payer: BC Managed Care – PPO | Admitting: Nurse Practitioner

## 2022-02-17 VITALS — BP 108/77 | HR 88 | Temp 99.1°F | Ht 66.0 in | Wt 148.0 lb

## 2022-02-17 DIAGNOSIS — R059 Cough, unspecified: Secondary | ICD-10-CM | POA: Diagnosis not present

## 2022-02-17 DIAGNOSIS — R509 Fever, unspecified: Secondary | ICD-10-CM

## 2022-02-17 LAB — POCT INFLUENZA A/B
Influenza A, POC: NEGATIVE
Influenza B, POC: NEGATIVE

## 2022-02-17 MED ORDER — AMOXICILLIN-POT CLAVULANATE 875-125 MG PO TABS: 1.0000 | ORAL_TABLET | Freq: Two times a day (BID) | ORAL | 0 refills | Status: DC

## 2022-02-17 MED ORDER — HYDROCODONE BIT-HOMATROP MBR 5-1.5 MG/5ML PO SOLN: 5.0000 mL | Freq: Three times a day (TID) | ORAL | 0 refills | Status: DC | PRN

## 2022-02-17 MED ORDER — PREDNISONE 20 MG PO TABS: 40.0000 mg | ORAL_TABLET | Freq: Every day | ORAL | 0 refills | Status: DC

## 2022-02-17 NOTE — Progress Notes (Signed)
  Orma Render, DNP, AGNP-c Primary Care & Sports Medicine [address]  [place], [place] [address] [phone]  Subjective:   Bradley Chandler is a 51 y.o. male presents to day for evaluation of: Acute Visit (Patient presents today with sore throat, coughing brown phelm. - COVID. Just feels bad. All symptoms since Friday)  Congestion/Cough Symptoms started on Friday evening On and off fevers (101/102) Has been taking tylenol which will break to the fever but comes right back Nagging headache Congestion and productive cough with brown/green mucous Feels like it is mostly in the chest No shob, ear pain, facial pain, palpitations, dizziness.  He has also had consistent diarrhea and decreased appetite since onset At home COVID testing negative   PMH, Medications, and Allergies reviewed and updated in chart as appropriate.   ROS negative except for what is listed in HPI. Objective:  BP 108/77   Pulse 88   Temp 99.1 F (37.3 C)   Ht '5\' 6"'$  (1.676 m)   Wt 148 lb (67.1 kg)   SpO2 98%   BMI 23.89 kg/m  Physical Exam Vitals and nursing note reviewed.  Constitutional:      Appearance: He is ill-appearing.  HENT:     Head: Normocephalic.     Right Ear: Tympanic membrane normal.     Left Ear: Tympanic membrane normal.     Nose: Congestion present.     Mouth/Throat:     Mouth: Mucous membranes are moist.     Pharynx: Posterior oropharyngeal erythema present.  Eyes:     Extraocular Movements: Extraocular movements intact.     Pupils: Pupils are equal, round, and reactive to light.  Cardiovascular:     Rate and Rhythm: Normal rate and regular rhythm.     Pulses: Normal pulses.  Pulmonary:     Effort: Pulmonary effort is normal.     Breath sounds: Rhonchi present.  Lymphadenopathy:     Cervical: Cervical adenopathy present.  Skin:    General: Skin is warm and dry.     Capillary Refill: Capillary refill takes less than 2 seconds.   Neurological:     General: No focal deficit present.     Mental Status: He is alert and oriented to person, place, and time.          Assessment & Plan:   1. Cough with fever Positive cough with purulent mucous production and fever since Friday of last week. COVID testing negative. Flu testing negative in office today. Suspect bacterial component is present given the productive cough- concerns for progression to pneumonia are present given increased risk of immunosuppression.  Left lung rhonchus. Right lung clear. Recommend increased rest and hydration for the rest of the week. Will send hycodan for nighttime cough relief- encourage deep breathing and cough during the day to increase mucous movement. Will also send steroid burst to help reduce inflammation in the lungs- he endorses this has worked well for him historically. OTC medications may be used to help with symptoms.    Orma Render, DNP, AGNP-c 02/17/2022  12:48 PM    History, Medications, Surgery, SDOH, and Family History reviewed and updated as appropriate.

## 2022-02-17 NOTE — Patient Instructions (Addendum)
I have sent in an antibiotic for you to use to help get rid of the infection. It appears bacterial based on the fevers and symptoms you have- I am concerned that we may be heading into pneumonia and I want to ward that off as much as possible.   It is OK to take over the counter medications with the antibiotic therapy to help with symptoms. Be sure to read labels and do not take medications with the same ingredients to prevent overdose.   If your symptoms show no improvement in the next 2-3 days please let us know.   Please rest as much as possible and stay hydrated.   You can use imodium to slow down diarrhea.

## 2022-02-18 ENCOUNTER — Ambulatory Visit (HOSPITAL_BASED_OUTPATIENT_CLINIC_OR_DEPARTMENT_OTHER): Admission: RE | Admit: 2022-02-18 | Payer: BC Managed Care – PPO | Source: Ambulatory Visit | Admitting: Urology

## 2022-02-18 DIAGNOSIS — I10 Essential (primary) hypertension: Secondary | ICD-10-CM

## 2022-02-18 SURGERY — INCISION AND DRAINAGE, ABSCESS
Anesthesia: General | Laterality: Left

## 2022-02-20 ENCOUNTER — Encounter (HOSPITAL_BASED_OUTPATIENT_CLINIC_OR_DEPARTMENT_OTHER): Payer: Self-pay | Admitting: Nurse Practitioner

## 2022-02-26 ENCOUNTER — Other Ambulatory Visit: Payer: Self-pay | Admitting: Urology

## 2022-03-07 DIAGNOSIS — J069 Acute upper respiratory infection, unspecified: Secondary | ICD-10-CM | POA: Diagnosis not present

## 2022-03-07 DIAGNOSIS — R059 Cough, unspecified: Secondary | ICD-10-CM | POA: Diagnosis not present

## 2022-03-10 ENCOUNTER — Ambulatory Visit (HOSPITAL_BASED_OUTPATIENT_CLINIC_OR_DEPARTMENT_OTHER): Payer: BC Managed Care – PPO

## 2022-03-11 ENCOUNTER — Ambulatory Visit (HOSPITAL_BASED_OUTPATIENT_CLINIC_OR_DEPARTMENT_OTHER): Payer: BC Managed Care – PPO

## 2022-03-11 DIAGNOSIS — E291 Testicular hypofunction: Secondary | ICD-10-CM | POA: Diagnosis not present

## 2022-03-12 ENCOUNTER — Encounter (HOSPITAL_BASED_OUTPATIENT_CLINIC_OR_DEPARTMENT_OTHER): Payer: Self-pay | Admitting: Urology

## 2022-03-12 NOTE — Progress Notes (Signed)
Spoke w/ via phone for pre-op interview--- pt Lab needs dos----   [employer]            Lab results------ EKG in epic  COVID test -----patient states asymptomatic no test needed Arrive at ------- 0700 NPO after MN NO Solid Food.  Clear liquids from MN until--- 0600 Med rec completed Medications to take morning of surgery ----- Coreg, Wellbutrin, Tenofvir Diabetic medication ----- N/A Patient instructed no nail polish to be worn day of surgery Patient instructed to bring photo id and insurance card day of surgery Patient aware to have Driver (ride ) / caregiver    for 24 hours after surgery--- Virgina Organ  Patient Special Instructions -----  Pre-Op special Istructions ----- Stop aspirin 5 days prior to procedure per physician. Do not take Hydrachlorothizide morning of surgery.  Patient verbalized understanding of instructions that were given at this phone interview. Patient denies shortness of breath, chest pain, fever, cough at this phone interview.

## 2022-03-14 ENCOUNTER — Encounter (HOSPITAL_BASED_OUTPATIENT_CLINIC_OR_DEPARTMENT_OTHER): Payer: Self-pay

## 2022-03-14 ENCOUNTER — Ambulatory Visit (HOSPITAL_BASED_OUTPATIENT_CLINIC_OR_DEPARTMENT_OTHER): Payer: BC Managed Care – PPO | Admitting: Family Medicine

## 2022-03-14 ENCOUNTER — Other Ambulatory Visit (HOSPITAL_BASED_OUTPATIENT_CLINIC_OR_DEPARTMENT_OTHER): Payer: Self-pay

## 2022-03-14 DIAGNOSIS — E291 Testicular hypofunction: Secondary | ICD-10-CM

## 2022-03-15 LAB — PSA TOTAL (REFLEX TO FREE): Prostate Specific Ag, Serum: 1.4 ng/mL (ref 0.0–4.0)

## 2022-03-15 LAB — TESTOSTERONE,FREE AND TOTAL
Testosterone, Free: 4.2 pg/mL — ABNORMAL LOW (ref 7.2–24.0)
Testosterone: 338 ng/dL (ref 264–916)

## 2022-03-19 ENCOUNTER — Encounter (HOSPITAL_BASED_OUTPATIENT_CLINIC_OR_DEPARTMENT_OTHER): Payer: Self-pay | Admitting: Family Medicine

## 2022-03-19 ENCOUNTER — Ambulatory Visit (INDEPENDENT_AMBULATORY_CARE_PROVIDER_SITE_OTHER): Payer: BC Managed Care – PPO | Admitting: Family Medicine

## 2022-03-19 DIAGNOSIS — E291 Testicular hypofunction: Secondary | ICD-10-CM

## 2022-03-19 MED ORDER — SYRINGE (DISPOSABLE) 3 ML MISC: 1.0000 | 1 refills | Status: DC

## 2022-03-19 MED ORDER — TESTOSTERONE CYPIONATE 200 MG/ML IM SOLN: 200.0000 mg | INTRAMUSCULAR | 0 refills | Status: DC

## 2022-03-19 NOTE — Progress Notes (Signed)
    Procedures performed today:    None.  Independent interpretation of notes and tests performed by another provider:   None.  Brief History, Exam, Impression, and Recommendations:    BP 113/81   Pulse 78   Ht '5\' 6"'$  (1.676 m)   Wt 148 lb (67.1 kg)   SpO2 98%   BMI 23.89 kg/m   Hypogonadism in male Patient has been administering 100 mg weekly dose of IM testosterone.  He has been doing well with the injections.  He is not quite sure as far as how he has been doing symptomatically as he has had an illness that he has been bending over for the past few weeks or so.  Initially, he was administering a higher dose of the medication by accident and feels that he was doing better with the higher dose. He did have labs completed recently and testosterone level was within normal range, however was not within the middle of range that we would typically aim for with testosterone replacement therapy.  PSA was normal.  CBC and lipid panel completed with his infectious disease doctor did not show significant abnormality. Discussed options with patient today, we will proceed with dose titration of testosterone to try to achieve middle of range for testosterone level.  New prescription sent to pharmacy, PDMP reviewed.  Plan for follow-up in about 2 to 3 months with nurse visit for labs about 1 week prior, to be drawn on a Tuesday.  Return in about 3 months (around 06/19/2022) for Testosterone therapy; labs 1 week prior - Labs to be drawn on a Tuesday.   ___________________________________________ Paislei Dorval de [place], MD, ABFM, CAQSM Primary Care and [place]

## 2022-03-19 NOTE — Patient Instructions (Signed)
  Medication Instructions:  Your physician recommends that you continue on your current medications as directed. Please refer to the Current Medication list given to you today. --If you need a refill on any your medications before your next appointment, please call your pharmacy first. If no refills are authorized on file call the office.-- Lab Work: Your physician has recommended that you have lab work today: No If you have labs (blood work) drawn today and your tests are completely normal, you will receive your results via [place] a phone call from our staff.  Please ensure you check your voicemail in the event that you authorized detailed messages to be left on a delegated number. If you have any lab test that is abnormal or we need to change your treatment, we will call you to review the results.  Referrals/Procedures/Imaging: No  Follow-Up: Your next appointment:   Your physician recommends that you schedule a follow-up appointment in: 2-3 months with Dr. De Chandler (labs a couple of days before).  You will receive a text message or e-mail with a link to a survey about your care and experience with [place] today! We would greatly appreciate your feedback!   Thanks for letting us be apart of your health journey!!  Primary Care and Sports Medicine   Bradley Chandler   We encourage you to activate your patient portal called "MyChart".  Sign up information is provided on this After Visit Summary.  MyChart is used to connect with patients for Virtual Visits (Telemedicine).  Patients are able to view lab/test results, encounter notes, upcoming appointments, etc.  Non-urgent messages can be sent to your provider as well. To learn more about what you can do with MyChart, please visit --  [email or web address].

## 2022-03-19 NOTE — Assessment & Plan Note (Signed)
Patient has been administering 100 mg weekly dose of IM testosterone.  He has been doing well with the injections.  He is not quite sure as far as how he has been doing symptomatically as he has had an illness that he has been bending over for the past few weeks or so.  Initially, he was administering a higher dose of the medication by accident and feels that he was doing better with the higher dose. He did have labs completed recently and testosterone level was within normal range, however was not within the middle of range that we would typically aim for with testosterone replacement therapy.  PSA was normal.  CBC and lipid panel completed with his infectious disease doctor did not show significant abnormality. Discussed options with patient today, we will proceed with dose titration of testosterone to try to achieve middle of range for testosterone level.  New prescription sent to pharmacy, PDMP reviewed.  Plan for follow-up in about 2 to 3 months with nurse visit for labs about 1 week prior, to be drawn on a Tuesday.

## 2022-03-21 ENCOUNTER — Ambulatory Visit (HOSPITAL_BASED_OUTPATIENT_CLINIC_OR_DEPARTMENT_OTHER)
Admission: RE | Admit: 2022-03-21 | Discharge: 2022-03-21 | Disposition: A | Discharge status: Home / Self Care | Payer: BC Managed Care – PPO | Source: Ambulatory Visit | Attending: Urology | Admitting: Urology

## 2022-03-21 ENCOUNTER — Other Ambulatory Visit: Payer: Self-pay

## 2022-03-21 ENCOUNTER — Ambulatory Visit (HOSPITAL_BASED_OUTPATIENT_CLINIC_OR_DEPARTMENT_OTHER): Payer: BC Managed Care – PPO | Admitting: Certified Registered Nurse Anesthetist

## 2022-03-21 ENCOUNTER — Encounter (HOSPITAL_BASED_OUTPATIENT_CLINIC_OR_DEPARTMENT_OTHER): Payer: Self-pay | Admitting: Urology

## 2022-03-21 ENCOUNTER — Encounter (HOSPITAL_BASED_OUTPATIENT_CLINIC_OR_DEPARTMENT_OTHER)
Admission: RE | Disposition: A | Discharge status: Home / Self Care | Payer: Self-pay | Source: Ambulatory Visit | Attending: Urology

## 2022-03-21 DIAGNOSIS — I10 Essential (primary) hypertension: Secondary | ICD-10-CM | POA: Insufficient documentation

## 2022-03-21 DIAGNOSIS — L02214 Cutaneous abscess of groin: Secondary | ICD-10-CM | POA: Diagnosis not present

## 2022-03-21 DIAGNOSIS — Z87891 Personal history of nicotine dependence: Secondary | ICD-10-CM | POA: Insufficient documentation

## 2022-03-21 DIAGNOSIS — N509 Disorder of male genital organs, unspecified: Secondary | ICD-10-CM | POA: Diagnosis not present

## 2022-03-21 DIAGNOSIS — L929 Granulomatous disorder of the skin and subcutaneous tissue, unspecified: Secondary | ICD-10-CM | POA: Diagnosis not present

## 2022-03-21 DIAGNOSIS — A630 Anogenital (venereal) warts: Secondary | ICD-10-CM | POA: Diagnosis not present

## 2022-03-21 DIAGNOSIS — R238 Other skin changes: Secondary | ICD-10-CM | POA: Insufficient documentation

## 2022-03-21 DIAGNOSIS — Z01818 Encounter for other preprocedural examination: Secondary | ICD-10-CM

## 2022-03-21 DIAGNOSIS — N4889 Other specified disorders of penis: Secondary | ICD-10-CM | POA: Diagnosis not present

## 2022-03-21 HISTORY — PX: INCISION AND DRAINAGE ABSCESS: SHX5864

## 2022-03-21 HISTORY — DX: Pneumonia, unspecified organism: J18.9

## 2022-03-21 HISTORY — PX: CO2 LASER APPLICATION: SHX5778

## 2022-03-21 LAB — POCT I-STAT, CHEM 8
BUN: 21 mg/dL — ABNORMAL HIGH (ref 6–20)
Calcium, Ion: 1.28 mmol/L (ref 1.15–1.40)
Chloride: 106 mmol/L (ref 98–111)
Creatinine, Ser: 1.1 mg/dL (ref 0.61–1.24)
Glucose, Bld: 97 mg/dL (ref 70–99)
HCT: 48 % (ref 39.0–52.0)
Hemoglobin: 16.3 g/dL (ref 13.0–17.0)
Potassium: 4 mmol/L (ref 3.5–5.1)
Sodium: 141 mmol/L (ref 135–145)
TCO2: 22 mmol/L (ref 22–32)

## 2022-03-21 SURGERY — INCISION AND DRAINAGE, ABSCESS
Anesthesia: General | Site: Penis | Laterality: Left

## 2022-03-21 MED ORDER — CEFAZOLIN SODIUM-DEXTROSE 2-4 GM/100ML-% IV SOLN
INTRAVENOUS | Status: AC
  Filled 2022-03-21: qty 100

## 2022-03-21 MED ORDER — PROPOFOL 10 MG/ML IV BOLUS
INTRAVENOUS | Status: AC
  Filled 2022-03-21: qty 20

## 2022-03-21 MED ORDER — BUPIVACAINE HCL (PF) 0.25 % IJ SOLN
INTRAMUSCULAR | Status: DC | PRN
  Administered 2022-03-21: 3 mL

## 2022-03-21 MED ORDER — LIDOCAINE HCL (PF) 2 % IJ SOLN
INTRAMUSCULAR | Status: AC
  Filled 2022-03-21: qty 5

## 2022-03-21 MED ORDER — DEXAMETHASONE SODIUM PHOSPHATE 10 MG/ML IJ SOLN
INTRAMUSCULAR | Status: DC | PRN
  Administered 2022-03-21: 5 mg via INTRAVENOUS

## 2022-03-21 MED ORDER — ONDANSETRON HCL 4 MG/2ML IJ SOLN
INTRAMUSCULAR | Status: AC
  Filled 2022-03-21: qty 2

## 2022-03-21 MED ORDER — DEXAMETHASONE SODIUM PHOSPHATE 10 MG/ML IJ SOLN
INTRAMUSCULAR | Status: AC
  Filled 2022-03-21: qty 1

## 2022-03-21 MED ORDER — EPHEDRINE 5 MG/ML INJ
INTRAVENOUS | Status: AC
  Filled 2022-03-21: qty 5

## 2022-03-21 MED ORDER — SODIUM CHLORIDE 0.9% FLUSH: 3.0000 mL | Freq: Two times a day (BID) | INTRAVENOUS | Status: DC

## 2022-03-21 MED ORDER — FENTANYL CITRATE (PF) 100 MCG/2ML IJ SOLN
INTRAMUSCULAR | Status: AC
  Filled 2022-03-21: qty 2

## 2022-03-21 MED ORDER — EPHEDRINE SULFATE-NACL 50-0.9 MG/10ML-% IV SOSY
PREFILLED_SYRINGE | INTRAVENOUS | Status: DC | PRN
  Administered 2022-03-21: 10 mg via INTRAVENOUS

## 2022-03-21 MED ORDER — PROPOFOL 10 MG/ML IV BOLUS
INTRAVENOUS | Status: DC | PRN
  Administered 2022-03-21: 20 mg via INTRAVENOUS
  Administered 2022-03-21: 50 mg via INTRAVENOUS
  Administered 2022-03-21: 150 mg via INTRAVENOUS

## 2022-03-21 MED ORDER — LACTATED RINGERS IV SOLN: INTRAVENOUS | Status: DC

## 2022-03-21 MED ORDER — HYDROCODONE-ACETAMINOPHEN 5-325 MG PO TABS: 1.0000 | ORAL_TABLET | Freq: Four times a day (QID) | ORAL | 0 refills | Status: DC | PRN

## 2022-03-21 MED ORDER — FENTANYL CITRATE (PF) 100 MCG/2ML IJ SOLN
25.0000 ug | INTRAMUSCULAR | Status: DC | PRN
  Administered 2022-03-21: 25 ug via INTRAVENOUS

## 2022-03-21 MED ORDER — GLYCOPYRROLATE PF 0.2 MG/ML IJ SOSY
PREFILLED_SYRINGE | INTRAMUSCULAR | Status: DC | PRN
  Administered 2022-03-21: .2 mg via INTRAVENOUS

## 2022-03-21 MED ORDER — MIDAZOLAM HCL 5 MG/5ML IJ SOLN
INTRAMUSCULAR | Status: DC | PRN
  Administered 2022-03-21: 2 mg via INTRAVENOUS

## 2022-03-21 MED ORDER — BACITRACIN-NEOMYCIN-POLYMYXIN OINTMENT TUBE
TOPICAL_OINTMENT | CUTANEOUS | Status: DC | PRN
  Administered 2022-03-21: 1 via TOPICAL

## 2022-03-21 MED ORDER — FENTANYL CITRATE (PF) 100 MCG/2ML IJ SOLN
INTRAMUSCULAR | Status: DC | PRN
  Administered 2022-03-21: 25 ug via INTRAVENOUS
  Administered 2022-03-21: 50 ug via INTRAVENOUS
  Administered 2022-03-21: 25 ug via INTRAVENOUS

## 2022-03-21 MED ORDER — MIDAZOLAM HCL 2 MG/2ML IJ SOLN
INTRAMUSCULAR | Status: AC
  Filled 2022-03-21: qty 2

## 2022-03-21 MED ORDER — LIDOCAINE 2% (20 MG/ML) 5 ML SYRINGE
INTRAMUSCULAR | Status: DC | PRN
  Administered 2022-03-21: 100 mg via INTRAVENOUS

## 2022-03-21 MED ORDER — CEFAZOLIN SODIUM-DEXTROSE 2-4 GM/100ML-% IV SOLN
2.0000 g | INTRAVENOUS | Status: AC
  Administered 2022-03-21: 2 g via INTRAVENOUS

## 2022-03-21 MED ORDER — ONDANSETRON HCL 4 MG/2ML IJ SOLN
INTRAMUSCULAR | Status: DC | PRN
  Administered 2022-03-21: 4 mg via INTRAVENOUS

## 2022-03-21 MED ORDER — 0.9 % SODIUM CHLORIDE (POUR BTL) OPTIME
TOPICAL | Status: DC | PRN
  Administered 2022-03-21: 500 mL

## 2022-03-21 SURGICAL SUPPLY — 59 items
ADH SKN CLS APL DERMABOND .7 (GAUZE/BANDAGES/DRESSINGS) ×4
APL SKNCLS STERI-STRIP NONHPOA (GAUZE/BANDAGES/DRESSINGS) ×2
APL SWBSTK 6 STRL LF DISP (MISCELLANEOUS)
APPLICATOR COTTON TIP 6 STRL (MISCELLANEOUS) IMPLANT
APPLICATOR COTTON TIP 6IN STRL (MISCELLANEOUS)
BENZOIN TINCTURE PRP APPL 2/3 (GAUZE/BANDAGES/DRESSINGS) ×2 IMPLANT
BLADE CLIPPER SENSICLIP SURGIC (BLADE) ×2 IMPLANT
BLADE SURG 10 STRL SS (BLADE) IMPLANT
BLADE SURG 15 STRL LF DISP TIS (BLADE) ×2 IMPLANT
BLADE SURG 15 STRL SS (BLADE) ×2
BNDG GAUZE DERMACEA FLUFF 4 (GAUZE/BANDAGES/DRESSINGS) ×2 IMPLANT
BNDG GZE DERMACEA 4 6PLY (GAUZE/BANDAGES/DRESSINGS) ×2
CLEANER CAUTERY TIP 5X5 PAD (MISCELLANEOUS) ×2 IMPLANT
CLOTH BEACON ORANGE TIMEOUT ST (SAFETY) ×2 IMPLANT
COVER BACK TABLE 60X90IN (DRAPES) ×2 IMPLANT
COVER MAYO STAND STRL (DRAPES) ×2 IMPLANT
DERMABOND ADVANCED .7 DNX12 (GAUZE/BANDAGES/DRESSINGS) IMPLANT
DISSECTOR ROUND CHERRY 3/8 STR (MISCELLANEOUS) IMPLANT
DRAIN PENROSE 0.25X18 (DRAIN) IMPLANT
DRAPE LAPAROTOMY TRNSV 102X78 (DRAPES) ×2 IMPLANT
DRSG TEGADERM 4X4.75 (GAUZE/BANDAGES/DRESSINGS) ×2 IMPLANT
ELECT NDL TIP 2.8 STRL (NEEDLE) IMPLANT
ELECT NEEDLE TIP 2.8 STRL (NEEDLE) IMPLANT
ELECT REM PT RETURN 9FT ADLT (ELECTROSURGICAL) ×2
ELECTRODE REM PT RTRN 9FT ADLT (ELECTROSURGICAL) ×2 IMPLANT
GAUZE 4X4 16PLY ~~LOC~~+RFID DBL (SPONGE) ×2 IMPLANT
GAUZE SPONGE 4X4 12PLY STRL (GAUZE/BANDAGES/DRESSINGS) ×4 IMPLANT
GLOVE SURG SS PI 8.0 STRL IVOR (GLOVE) ×2 IMPLANT
GOWN STRL REUS W/TWL LRG LVL3 (GOWN DISPOSABLE) ×4 IMPLANT
KIT TURNOVER CYSTO (KITS) ×2 IMPLANT
MANIFOLD NEPTUNE II (INSTRUMENTS) IMPLANT
NEEDLE HYPO 22GX1.5 SAFETY (NEEDLE) ×2 IMPLANT
NS IRRIG 500ML POUR BTL (IV SOLUTION) IMPLANT
PACK BASIN DAY SURGERY FS (CUSTOM PROCEDURE TRAY) ×2 IMPLANT
PAD CLEANER CAUTERY TIP 5X5 (MISCELLANEOUS) ×2
PENCIL SMOKE EVACUATOR (MISCELLANEOUS) ×2 IMPLANT
STRIP CLOSURE SKIN 1/2X4 (GAUZE/BANDAGES/DRESSINGS) ×2 IMPLANT
SUPPORT SCROTAL LG STRP (MISCELLANEOUS) IMPLANT
SUT CHROMIC 3 0 SH 27 (SUTURE) ×2 IMPLANT
SUT CHROMIC GUT AB #0 18 (SUTURE) IMPLANT
SUT MNCRL AB 4-0 PS2 18 (SUTURE) IMPLANT
SUT PROLENE 2 0 CT2 30 (SUTURE) IMPLANT
SUT SILK 3 0 TIES 17X18 (SUTURE)
SUT SILK 3-0 18XBRD TIE BLK (SUTURE) IMPLANT
SUT VIC AB 3-0 SH 27 (SUTURE)
SUT VIC AB 3-0 SH 27X BRD (SUTURE) IMPLANT
SUT VIC AB 4-0 P-3 18XBRD (SUTURE) IMPLANT
SUT VIC AB 4-0 P3 18 (SUTURE)
SUT VIC AB 4-0 PS2 18 (SUTURE) IMPLANT
SUT VICRYL 0 TIES 12 18 (SUTURE) IMPLANT
SUT VICRYL 2 0 18  UND BR (SUTURE)
SUT VICRYL 2 0 18 UND BR (SUTURE) IMPLANT
SYR BULB IRRIG 60ML STRL (SYRINGE) ×2 IMPLANT
SYR CONTROL 10ML LL (SYRINGE) ×2 IMPLANT
TOWEL OR 17X26 10 PK STRL BLUE (TOWEL DISPOSABLE) ×4 IMPLANT
TRAY DSU PREP LF (CUSTOM PROCEDURE TRAY) ×2 IMPLANT
TUBE CONNECTING 12X1/4 (SUCTIONS) ×2 IMPLANT
WATER STERILE IRR 500ML POUR (IV SOLUTION) IMPLANT
YANKAUER SUCT BULB TIP NO VENT (SUCTIONS) ×2 IMPLANT

## 2022-03-21 NOTE — Discharge Instructions (Addendum)
Apply neosporin ointment to the lasered area on the penis and the suprapubic area 2-3x daily until healed. 2.   The inguinal incision is coated with a plastic dressing that will eventually peel off.  Please let us know if you have fever >101, bleeding or excess pain in this area. 3.   You may shower tomorrow. 4.   Avoid sexual activity until the lasered area has healed.  5.   Light activity for 48 hours.          Post Anesthesia Home Care Instructions  Activity: Get plenty of rest for the remainder of the day. A responsible individual must stay with you for 24 hours following the procedure.  For the next 24 hours, DO NOT: -Drive a car -Paediatric nurse -Drink alcoholic beverages -Take any medication unless instructed by your physician -Make any legal decisions or sign important papers.  Meals: Start with liquid foods such as gelatin or soup. Progress to regular foods as tolerated. Avoid greasy, spicy, heavy foods. If nausea and/or vomiting occur, drink only clear liquids until the nausea and/or vomiting subsides. Call your physician if vomiting continues.  Special Instructions/Symptoms: Your throat may feel dry or sore from the anesthesia or the breathing tube placed in your throat during surgery. If this causes discomfort, gargle with warm salt water. The discomfort should disappear within 24 hours.

## 2022-03-21 NOTE — Anesthesia Procedure Notes (Signed)
Procedure Name: LMA Insertion Date/Time: 03/21/2022 8:41 AM  Performed by: Rogers Blocker, CRNAPre-anesthesia Checklist: Patient identified, Emergency Drugs available, Suction available and Patient being monitored Patient Re-evaluated:Patient Re-evaluated prior to induction Oxygen Delivery Method: Circle System Utilized Preoxygenation: Pre-oxygenation with 100% oxygen Induction Type: IV induction Ventilation: Mask ventilation without difficulty LMA: LMA inserted LMA Size: 4.0 Number of attempts: 1 Placement Confirmation: positive ETCO2 Tube secured with: Tape Dental Injury: Teeth and Oropharynx as per pre-operative assessment

## 2022-03-21 NOTE — Op Note (Signed)
Procedure: 1.  Excision of left inguinal chronic abscess and unroofing of small suprapubic folliculitis. 2.  CO2 laser ablation of penile papules.  Preop diagnosis: 1.  Chronic left inguinal sinus/abscess. 2.  Inflamed suprapubic follicle. 3.  Ventral penile papules.  Postop diagnosis: Same.  Surgeon: Dr. Irine Seal.  Anesthesia: General.  Specimen: Chronic sinus tract.  Drain: None.  EBL: None.  Complications: None.  Indications: The patient is a 50 year old male has had a chronic intermittent abscess/sinus on the left lateral mons in the inguinal area that is recurred after multiple rounds of antibiotics.  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  Procedure: He was taken operating room where he was given 2 g of Ancef.  A general anesthetic was induced.  He was placed in the supine position and he was fitted with PAS hose.  He was prepped with Betadine solution and draped in the usual sterile fashion.  The left inguinal area was inspected.  He has been on antibiotics recently so there was minimal inflammation but there was a small area of induration with what appeared to be residual sinus tract on the left lateral mons and inguinal area.  Thorough inspection revealed no additional areas of concern.  The area of concern was then excised with an area of excision of approximately 5 mm x 2 cm.  There was no significant subcutaneous abnormality noted.  Hemostasis was achieved with the Bovie.  The wound was infiltrated with approximately 3 mL of quarter percent Marcaine.  And was felt since there was no active infection that a primary closure with a running intracuticular 4-0 Monocryl was most appropriate.  Once the wound had been closed Dermabond was applied for further support.  The inflamed follicle in the suprapubic area was then unroofed with a knife.  There was no purulent  material.  The CO2 laser was then prepared and set on 5 W and the papules were then ablated.  There were approximately 6-7 ablated with the largest being 1 to 2 mm.  Neosporin was applied to the unroofed follicle and to the area of laser ablation.  His anesthetic was reversed and he was moved recovery in stable condition.  There were no complications.

## 2022-03-21 NOTE — Anesthesia Preprocedure Evaluation (Signed)
Anesthesia Evaluation  Patient identified by MRN, date of birth, ID band Patient awake  General Assessment Comment:Hx noted Dr. Nyoka Cowden  Reviewed: Allergy & Precautions, NPO status , Patient's Chart, lab work & pertinent test results  Airway Mallampati: II       Dental   Pulmonary pneumonia, former smoker,    breath sounds clear to auscultation       Cardiovascular hypertension,  Rhythm:Regular Rate:Normal     Neuro/Psych TIACVA    GI/Hepatic negative GI ROS, Neg liver ROS,   Endo/Other  negative endocrine ROS  Renal/GU negative Renal ROS  negative genitourinary   Musculoskeletal   Abdominal   Peds  Hematology   Anesthesia Other Findings   Reproductive/Obstetrics                             Anesthesia Physical Anesthesia Plan  ASA: 3  Anesthesia Plan: General   Post-op Pain Management:    Induction: Intravenous  PONV Risk Score and Plan: 3 and Ondansetron, Dexamethasone, Midazolam and Treatment may vary due to age or medical condition  Airway Management Planned: Oral ETT  Additional Equipment:   Intra-op Plan:   Post-operative Plan: Extubation in OR  Informed Consent: I have reviewed the patients History and Physical, chart, labs and discussed the procedure including the risks, benefits and alternatives for the proposed anesthesia with the patient or authorized representative who has indicated his/her understanding and acceptance.     Dental advisory given  Plan Discussed with: CRNA and Anesthesiologist  Anesthesia Plan Comments:         Anesthesia Quick Evaluation

## 2022-03-21 NOTE — Progress Notes (Signed)
Patient presented for appt. Unfortunately, lab result have not been finalized and so patient was rescheduled for future date to follow-up and review labs.

## 2022-03-21 NOTE — Anesthesia Postprocedure Evaluation (Signed)
Anesthesia Post Note  Patient: Bradley Chandler  Procedure(s) Performed: EXCISION OF LEFT INGUINAL ABSESS,EXCISION OF SUPRAPUBIC FOLLICULITIS (Left: Groin) CO2 LASER OF PENILE PAPULES (Left: Penis)     Patient location during evaluation: PACU Anesthesia Type: General Level of consciousness: awake Pain management: pain level controlled Vital Signs Assessment: post-procedure vital signs reviewed and stable Respiratory status: spontaneous breathing Cardiovascular status: stable Postop Assessment: no apparent nausea or vomiting Anesthetic complications: no   No notable events documented.  Last Vitals:  Vitals:   03/21/22 1015 03/21/22 1030  BP: (!) 128/96 (!) 125/96  Pulse: 72 (!) 57  Resp: 10 14  Temp:    SpO2: 96% 97%    Last Pain:  Vitals:   03/21/22 1030  TempSrc:   PainSc: 3                  Doral Ventrella

## 2022-03-21 NOTE — H&P (Signed)
CC: Painful lump in the left groin   Hx; Bradley Chandler is a 51 yo male who is sent by Dr. [place] for a painful lump in the left groin. He first noted it about 10 days ago. He was seen on 4/18 and was given bactrim which help. The lesion spontaneously drained. The drainage stopped yesterday. He has not had a prior issue. He has had no voiding complaints. He had associated fever with it.   10/01/21: Bradley Chandler returns today in f/u. He reports improvement in the left groin induration and no pain or fever. His WBC count was low at 2.3 and he had a low neutrophil count. We have sent that result to his ID physician at [facility] will review the results with him at his next f/u. With his HIV history, I am not sure how to interpret the result clearly.   10/08/21: Bradley Chandler returns today in f/u with another episode of drainage from the left inguinal phlegmon but he had no pain or induration with this episode.   01/31/22: Bradley Chandler returns today in f/u for a recurrences of the left inguinal inflammation. he has pain that began last night. He has had malaise but no fever.   02/26/22: Bradley Chandler was scheduled to resect the draining sinus but had to cancel due to illness. He returns now with a new knot over the pubic bone. The antibiotics suppressed the lesion in the groin. He has some pearlescent papules on the penis and darker spots on the scrotum which have been present for some time. He didn't have a benefit from bactrim or augmentin that he took for pneumonia for the suprapubic lesion which is firm and tender.     ALLERGIES: No Allergies    MEDICATIONS: Aspirin  Hydrochlorothiazide 12.5 mg tablet  Bupropion Xl 450 mg tablet, extended release 24 hr  Carvedilol 6.25 mg tablet  Symfi  Testosterone Cypionate 100 mg/ml vial     GU PSH: No GU PSH      PSH Notes: hand surgery, craniotomy   NON-GU PSH: No Non-GU PSH    GU PMH: No GU PMH      PMH Notes: HIV   NON-GU PMH: Cutaneous abscess of groin, The left inguinal inflammation has  recurred but I don't see anything that needs acute drainage. I will put him on bactrim DS bid for a week and then nightly for 2 pending excision of the affected area. I have reviewed the risks of the procedure to remove the site of the chronic infection including bleeding, infection, wound complications, pain and recurrence as well as anesthetic complications. - 01/31/2022, The lesion drained again. I discussed another course of antibiotic vs surgical exploration and debridement and he would prefer the latter. I reviewed the risks of bleeding,infection, wound healing issues, thrombosis and anesthetic complications. , - 10/08/2021, He will return prn. , - 10/01/2021, His lesion drained and there is no residual cellulitis but there is some induration. I will have him stay on the bactrim for 2 weeks total and return in 2 weeks for reexam. CBC today since he remains fatigued. , - 09/16/2021 Other neutropenia, He has a neutropenia on CBC and we have sent the results to his ID physician. - 10/01/2021 Anxiety Depression GERD Hypertension Stroke/TIA    FAMILY HISTORY: Death In The Family Father - Other Death In The Family Mother - Other Heart Disease - Runs in Family stroke - Father   SOCIAL HISTORY: Marital Status: Married Preferred Language: English; Ethnicity: Not Hispanic Or  Latino; Race: White Current Smoking Status: Patient does not smoke anymore. Has not smoked since 08/25/2014. Smoked for 5 years.   Tobacco Use Assessment Completed: Used Tobacco in last 30 days? Does not use smokeless tobacco. Drinks 1 drink per week.  Does not use drugs. Drinks 1 caffeinated drink per day. 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  Gastrointestinal (Upper):   Patient denies nausea, vomiting, and indigestion/ heartburn.  Gastrointestinal (Lower):   Patient denies diarrhea and constipation.  Constitutional:   Patient denies fever, night sweats, weight loss, and fatigue.  Skin:   Patient denies skin rash/ lesion and itching.  Eyes:   Patient denies blurred vision and double vision.  Ears/ Nose/ Throat:   Patient denies sore throat and sinus problems.  Hematologic/Lymphatic:   Patient denies swollen glands and easy bruising.  Cardiovascular:   Patient denies leg swelling and chest pains.  Respiratory:   Patient denies cough and shortness of breath.  Endocrine:   Patient denies excessive thirst.  Musculoskeletal:   Patient denies back pain and joint pain.  Neurological:   Patient denies headaches and dizziness.  Psychologic:   Patient denies anxiety and depression.   VITAL SIGNS: None   GU PHYSICAL EXAMINATION:    Scrotum: Numerous cherry red spots. No edema. No cysts. No warts.   Penis: Circumcised, he has multiple small papules on the ventral frenulum of uncertain etiology.    MULTI-SYSTEM PHYSICAL EXAMINATION:    Constitutional: Well-nourished. No physical deformities. Normally developed. Good grooming.  Skin: He has a small inflammed follicle in the suprapubic area and induration in the left inguinal area at the site of the draining sinus.      Complexity of Data:  Records Review:   Previous Patient Records   PROCEDURES: None   ASSESSMENT:      ICD-10 Details  2 GU:   Disorder of Penis Ot - N48.89 Undiagnosed New Problem - He has some small papules on the ventral frenulum which could be pearlescent papules or possible small condyloma. I will use the CO2 laser to treat those. Risks reviewed.   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 He would like to get rescheduled for surgical management and I will get that set up.    PLAN:           Schedule Return Visit/Planned Activity: Next Available Appointment - Schedule Surgery          Document Letter(s):  Created for Patient: Clinical Summary         Notes:   CC: Dr. Arlina Robes. [place].         Next Appointment:      Next Appointment: 03/21/2022 09:00 AM    Appointment Type: Surgery     Location: [facility] [phone]  Provider: Irine Seal, M.D.    Reason for Visit: OP EXC LT ING ABSCESS EXC OF S/P FOLLICULITIS CO2 LASER OF PENILE PAPULES

## 2022-03-21 NOTE — Transfer of Care (Signed)
Immediate Anesthesia Transfer of Care Note  Patient: Netta Neat  Procedure(s) Performed: EXCISION OF LEFT INGUINAL ABSESS,EXCISION OF SUPRAPUBIC FOLLICULITIS (Left: Groin) CO2 LASER OF PENILE PAPULES (Left: Penis)  Patient Location: PACU  Anesthesia Type:General  Level of Consciousness: drowsy and responds to stimulation  Airway & Oxygen Therapy: Patient Spontanous Breathing  Post-op Assessment: Report given to RN and Post -op Vital signs reviewed and stable  Post vital signs: Reviewed and stable  Last Vitals:  Vitals Value Taken Time  BP    Temp 36.2 C 03/21/22 0952  Pulse 55 03/21/22 0958  Resp 11 03/21/22 0958  SpO2 95 % 03/21/22 0958  Vitals shown include unvalidated device data.  Last Pain:  Vitals:   03/21/22 0952  TempSrc:   PainSc: 0-No pain      Patients Stated Pain Goal: 2 (52/48/18 5909)  Complications: No notable events documented.

## 2022-03-24 ENCOUNTER — Encounter (HOSPITAL_BASED_OUTPATIENT_CLINIC_OR_DEPARTMENT_OTHER): Payer: Self-pay | Admitting: Urology

## 2022-03-24 LAB — SURGICAL PATHOLOGY

## 2022-04-04 DIAGNOSIS — L02214 Cutaneous abscess of groin: Secondary | ICD-10-CM | POA: Diagnosis not present

## 2022-04-04 DIAGNOSIS — N4889 Other specified disorders of penis: Secondary | ICD-10-CM | POA: Diagnosis not present

## 2022-04-29 ENCOUNTER — Telehealth: Payer: Self-pay | Admitting: Family Medicine

## 2022-04-29 NOTE — Telephone Encounter (Signed)
Per Dr. Jonni Sanger  -patient and partner are approved to be added to panel. -Work-in / Urgent appt not requested  -Please reach out to schedule, as appropriate.

## 2022-05-08 DIAGNOSIS — F419 Anxiety disorder, unspecified: Secondary | ICD-10-CM | POA: Diagnosis not present

## 2022-05-08 DIAGNOSIS — F439 Reaction to severe stress, unspecified: Secondary | ICD-10-CM | POA: Diagnosis not present

## 2022-05-08 DIAGNOSIS — G4700 Insomnia, unspecified: Secondary | ICD-10-CM | POA: Diagnosis not present

## 2022-05-31 IMAGING — MR MR HEAD WO/W CM
8 of 14 series · 34 of 48 positions shown · IV contrast (gadavist)
Comparison: 7 mL Gadavist.

CLINICAL DATA: Neuro deficit, stroke suspected, loss site in right
eye this morning

EXAM:
MRI HEAD WITHOUT AND WITH CONTRAST
TECHNIQUE: Multiplanar, multiecho pulse sequences of the brain and surrounding
structures were obtained without and with intravenous contrast.
CONTRAST:  7mL GADAVIST GADOBUTROL 1 MMOL/ML IV SOLN

[Series 3: DWI · axial · 3.0mm · 1.09mm/px · z∈[-77,+79]mm · 9 of 106 slices shown (1 of 4)]
[im 1/106]
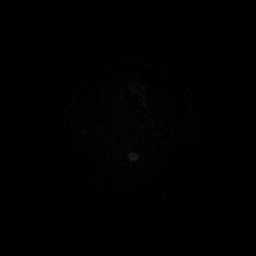
[im 14/106]
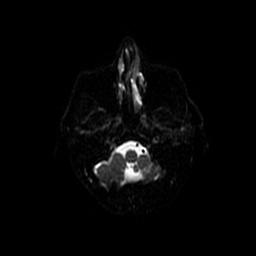
[im 27/106]
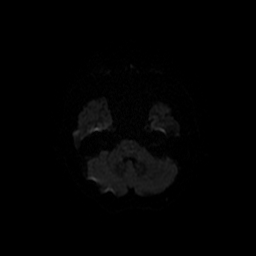
[im 40/106]
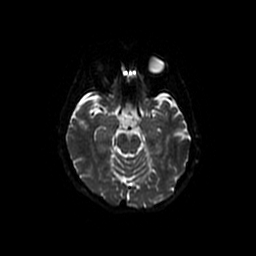
[im 53/106]
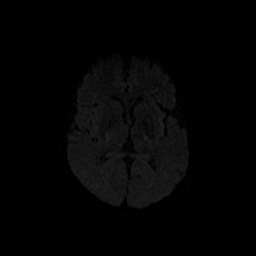
[im 66/106]
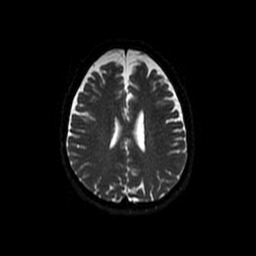
[im 79/106]
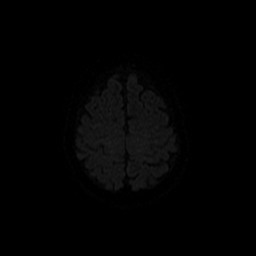
[im 92/106]
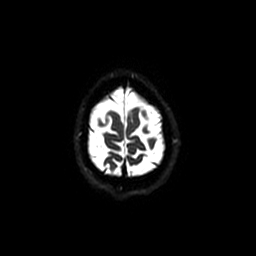
[im 106/106]
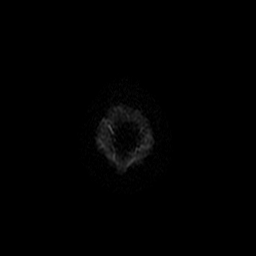

[Series 4: DWI · coronal · 5.0mm · 1.09mm/px · 6 of 78 slices shown (2 of 4)]
[im 1/78]
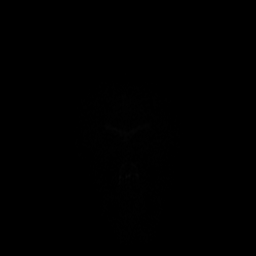
[im 16/78]
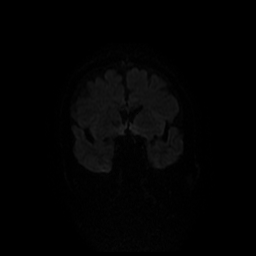
[im 31/78]
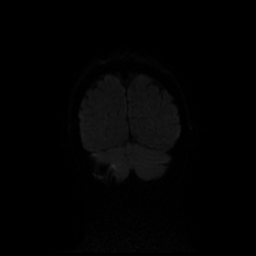
[im 47/78]
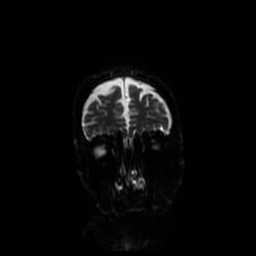
[im 62/78]
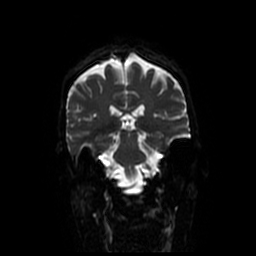
[im 78/78]
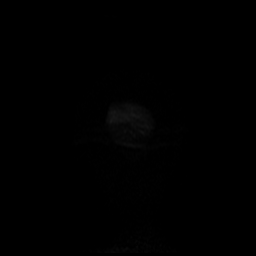

[Series 7: FLAIR · axial · 3.0mm · 0.47mm/px · z∈[-81,+75]mm · 2 of 27 slices shown]
[im 1/27]
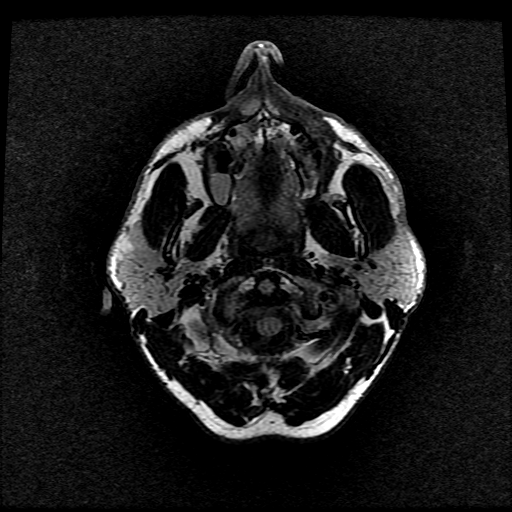
[im 27/27]
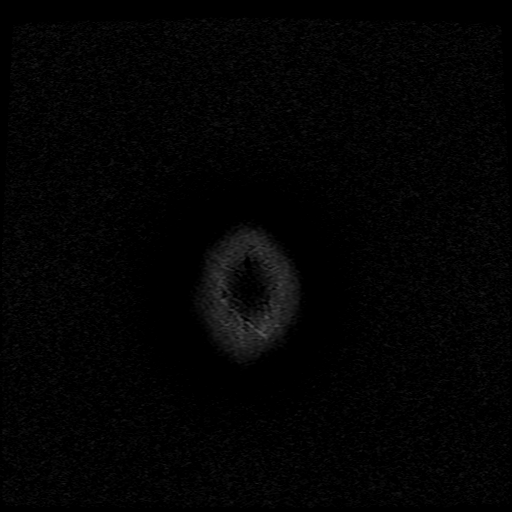

[Series 11: T1 post-contrast · axial · 3.0mm · 0.47mm/px · z∈[-82,+77]mm · 4 of 54 slices shown (1 of 3)]
[im 1/54]
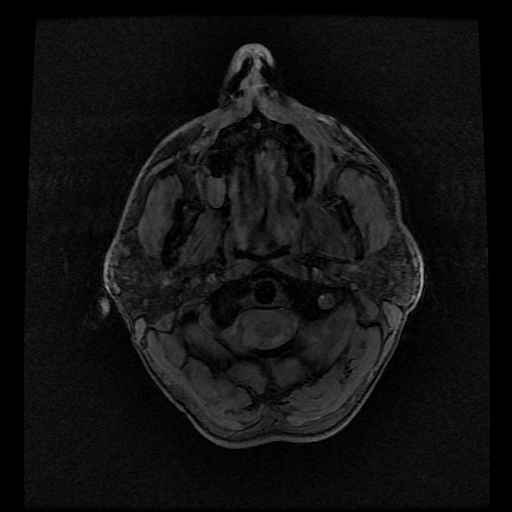
[im 18/54]
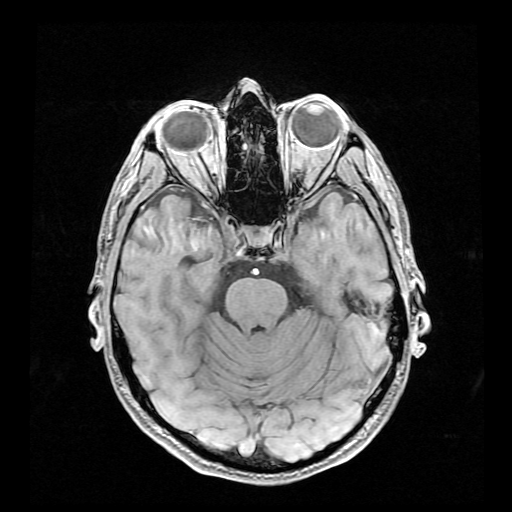
[im 36/54]
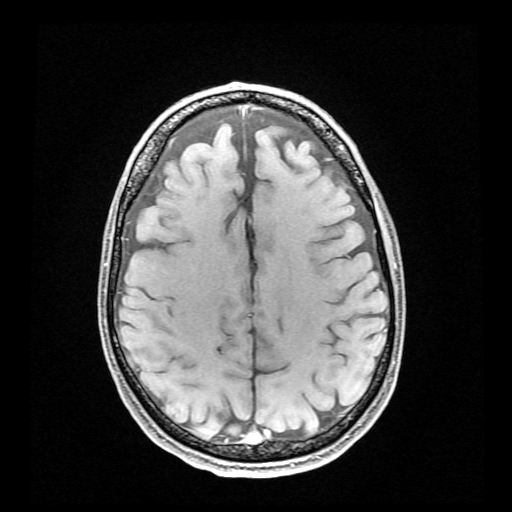
[im 54/54]
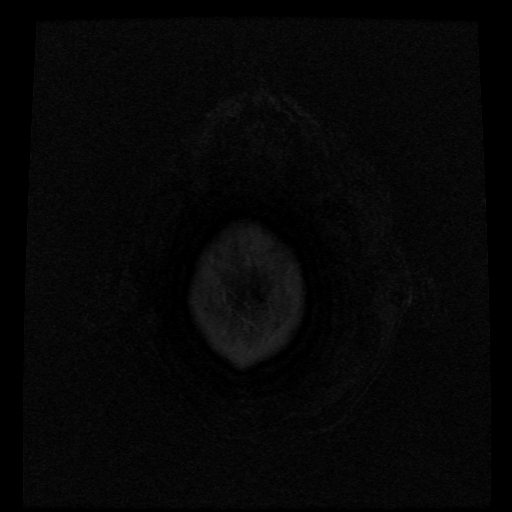

[Series 12: T1 post-contrast · axial · 3.0mm · 0.47mm/px · z∈[-82,+77]mm · 4 of 54 slices shown (2 of 3)]
[im 1/54]
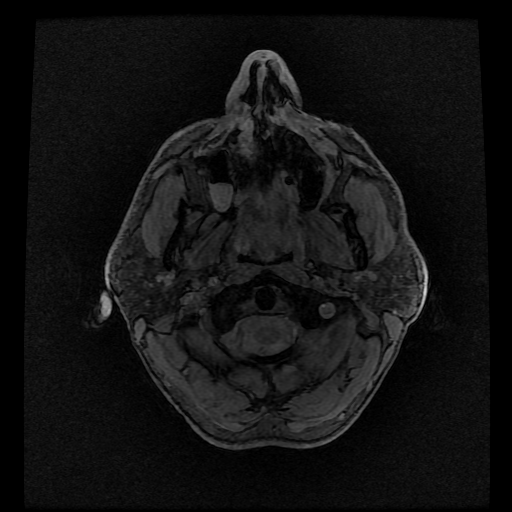
[im 18/54]
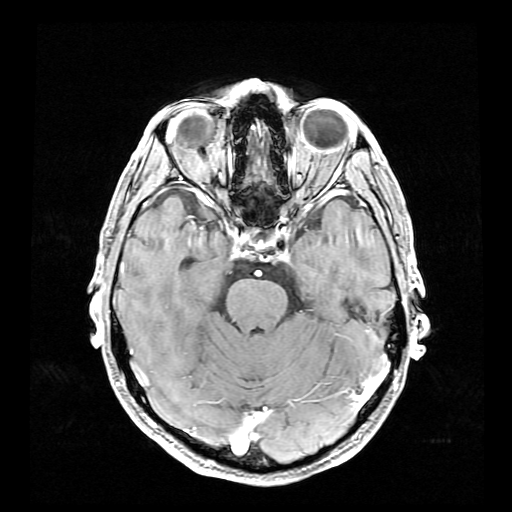
[im 36/54]
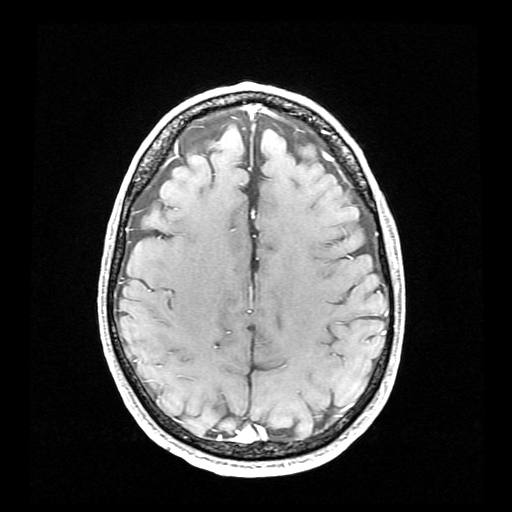
[im 54/54]
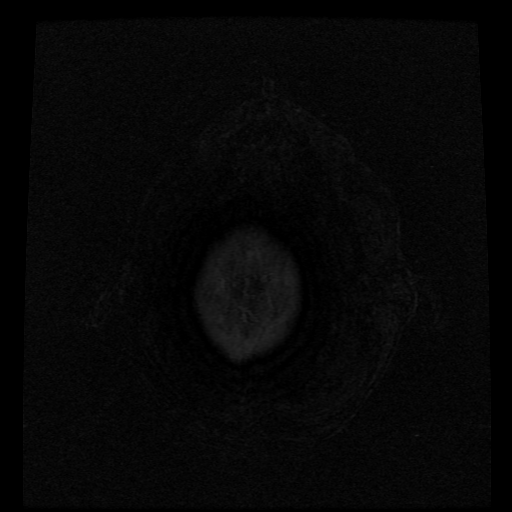

[Series 13: T1 post-contrast · coronal · 5.0mm · 0.39mm/px · 2 of 30 slices shown (3 of 3)]
[im 1/30]
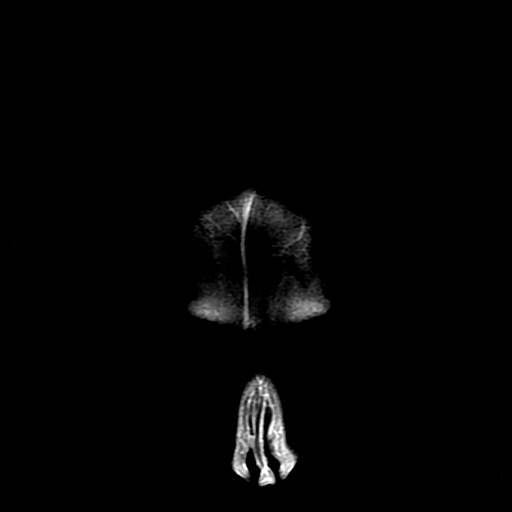
[im 30/30]
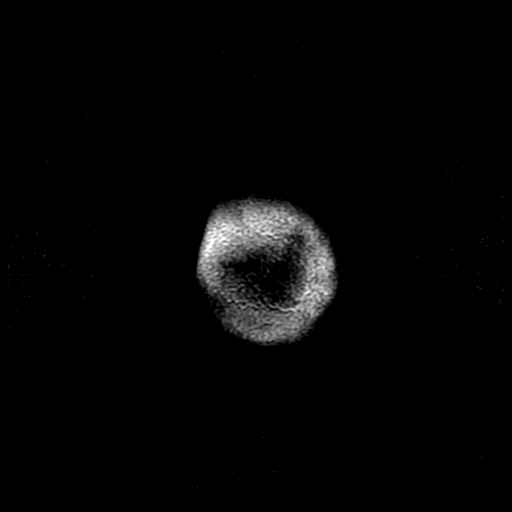

[Series 300: DWI · axial · 3.0mm · 1.09mm/px · z∈[-77,+79]mm · 4 of 53 slices shown (3 of 4)]
[im 1/53]
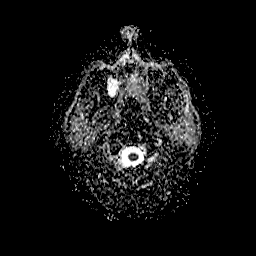
[im 18/53]
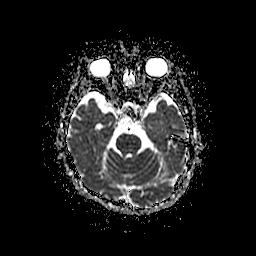
[im 35/53]
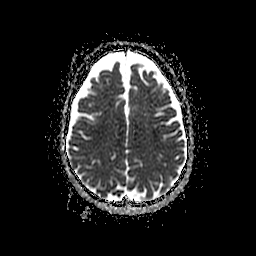
[im 53/53]
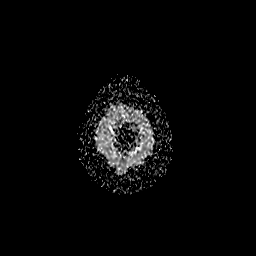

[Series 400: DWI · coronal · 5.0mm · 1.09mm/px · 3 of 39 slices shown (4 of 4)]
[im 1/39]
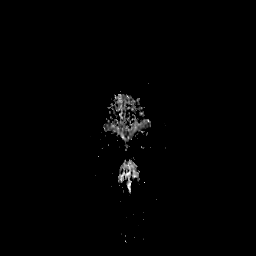
[im 20/39]
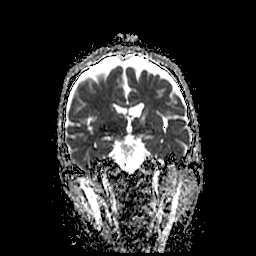
[im 39/39]
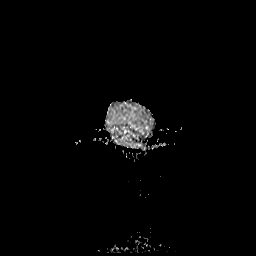

[34 of 48 positions shown; findings below may reference images not displayed]

FINDINGS: Brain: No restricted diffusion in the parenchyma to suggest acute or
subacute infarct. No parenchymal hemorrhage, mass, mass effect, or
midline shift. No hydrocephalus or extra-axial collection. No
abnormal parenchymal enhancement. Chronic appearing left basal
ganglia lacunar infarct. Scattered small foci of increased T2 signal
in the bilateral frontal lobes (series 7, image 20, 19, 18, and 13,
for example).

Prior right suboccipital craniotomy with encephalomalacia in the
right cerebellar hemisphere.

Within the lateral ventricles, there are 2 subcentimeter foci that
demonstrate increased signal on diffusion-weighted imaging (series
3, image 30 and 31), with ADC correlates; these lesions are T1
hypointense and T2 hyperintense, most likely choroid plexus
xanthogranulomas, which are not of clinical significance.

Vascular: Normal flow voids.

Skull and upper cervical spine: Prior right suboccipital craniotomy.
Normal marrow signal.

Sinuses/Orbits: Mucous retention cyst in the right maxillary sinus.
Otherwise clear. The orbits are unremarkable.

Other: Fluid in the right-greater-than-left mastoid air cells.
IMPRESSION: 1. No acute infarct or other intracranial process. No etiology seen
for the patient's vision loss.
2. Scattered small foci of increased T2 signal in the bilateral
frontal lobes, which are nonspecific and can be seen in the setting
of chronic migraines or, given the lacunar infarct in the left
internal capsule, these may represent early sequela of small vessel
ischemic disease. While T2 hyperintense foci can also be seen with
demyelinating disease, these lesions are not in a pattern consistent
with demyelinating disease.
3. Choroid plexus xanthogranulomas in the lateral ventricles, which
are benign and do not require further follow-up.

## 2022-05-31 IMAGING — CT CT HEAD CODE STROKE
3 of 4 series · 15 of 47 positions shown, 18 images · non-contrast
Comparison: None.

CLINICAL DATA: Code stroke.  Unable to see from right eye.

EXAM:
CT HEAD WITHOUT CONTRAST
TECHNIQUE: Contiguous axial images were obtained from the base of the skull
through the vertex without intravenous contrast.

[Series 3: head wo · axial · 0.43mm/px · z∈[-43,+82]mm · 9 of 31 slices shown, 12 images]
[im 3/31  brain]
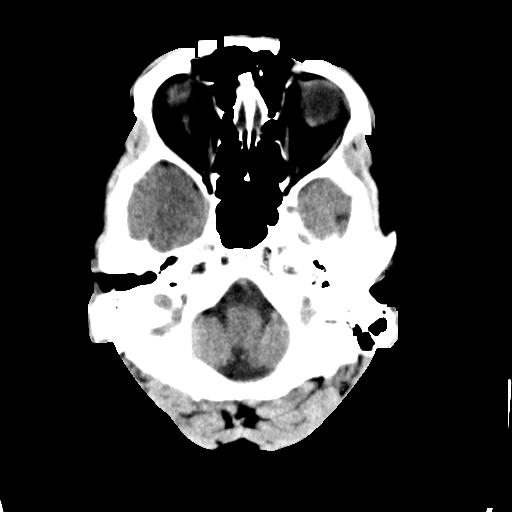
[im 3/31  bone]
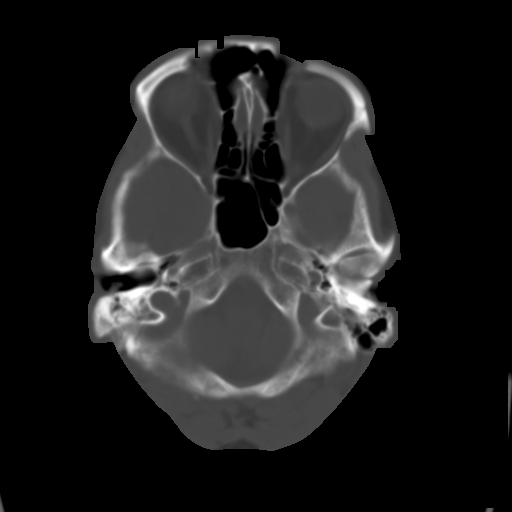
[im 7/31  brain]
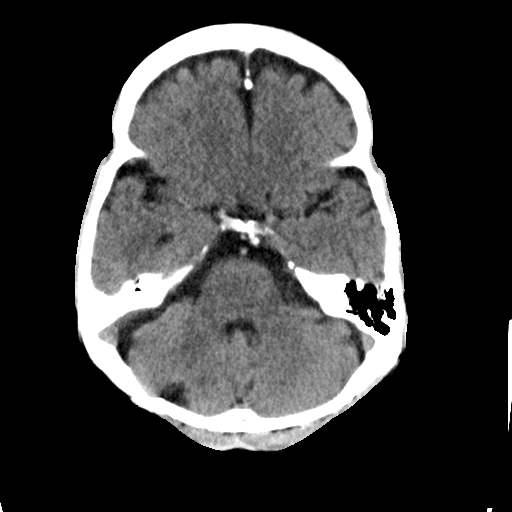
[im 9/31  brain]
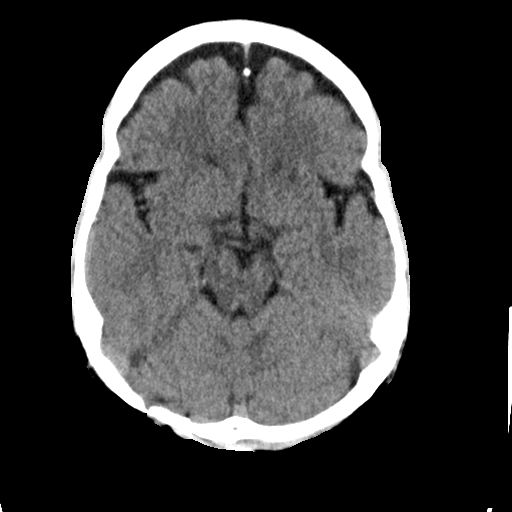
[im 13/31  brain]
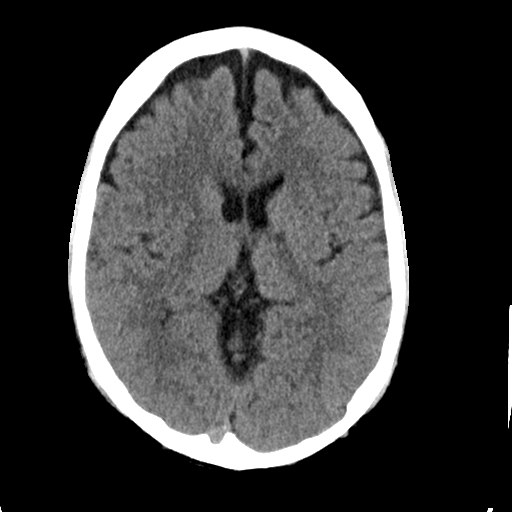
[im 16/31  brain]
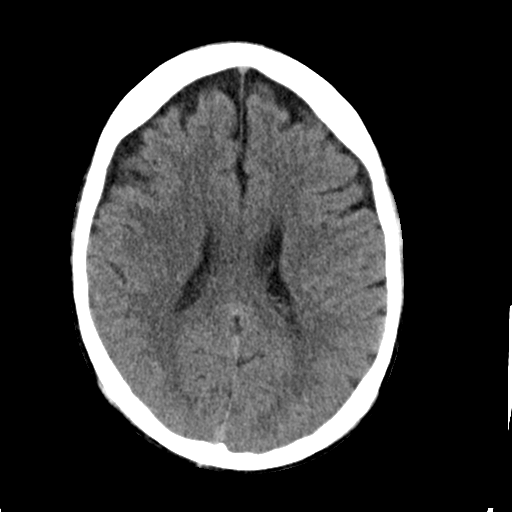
[im 16/31  bone]
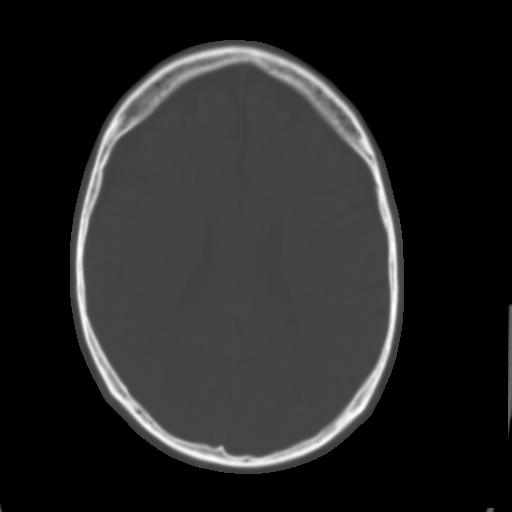
[im 18/31  brain]
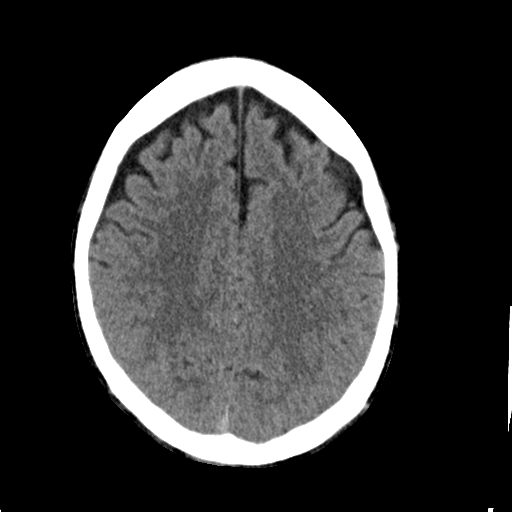
[im 22/31  brain]
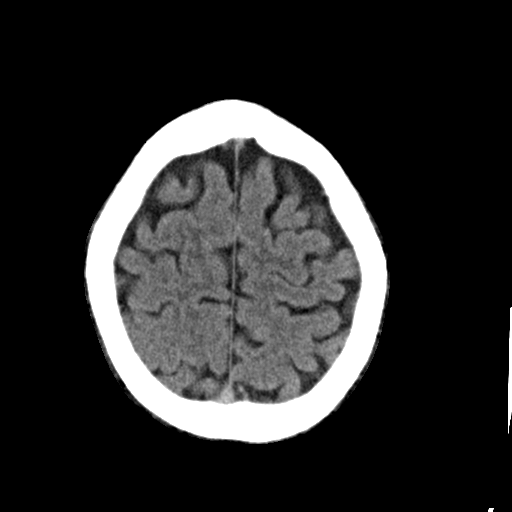
[im 24/31  brain]
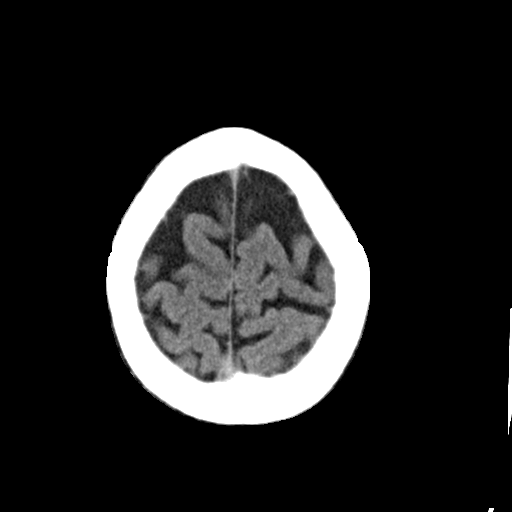
[im 28/31  brain]
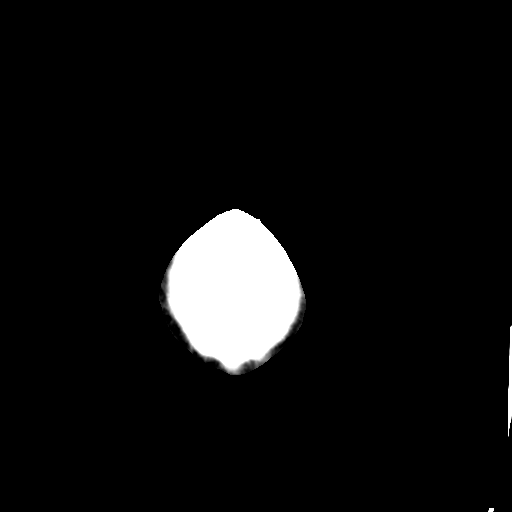
[im 28/31  bone]
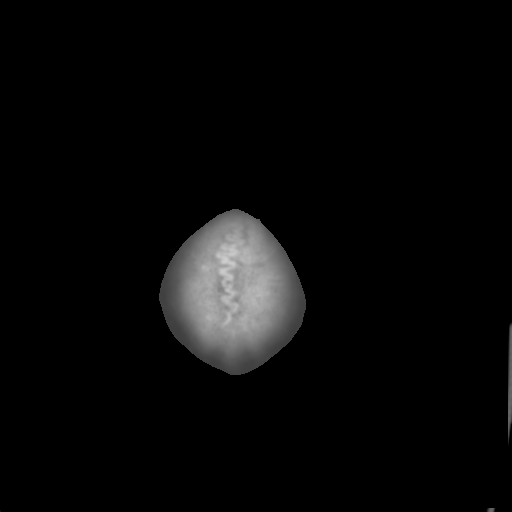

[Series 5: coronal soft · coronal · 0.31mm/px · 3 of 68 slices shown]
[im 23/68  brain]
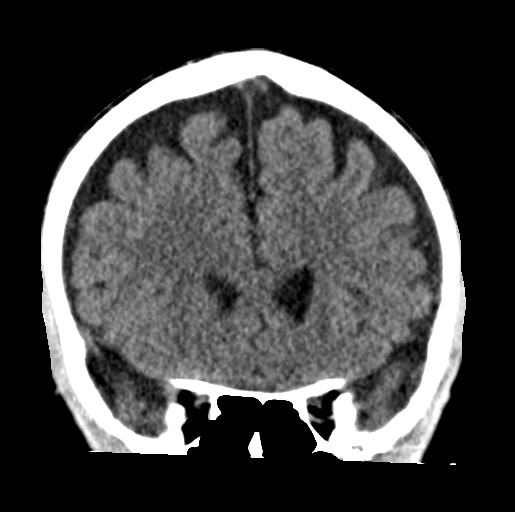
[im 30/68  brain]
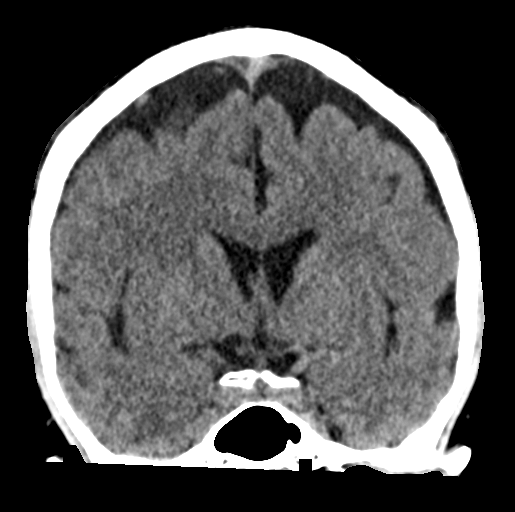
[im 38/68  brain]
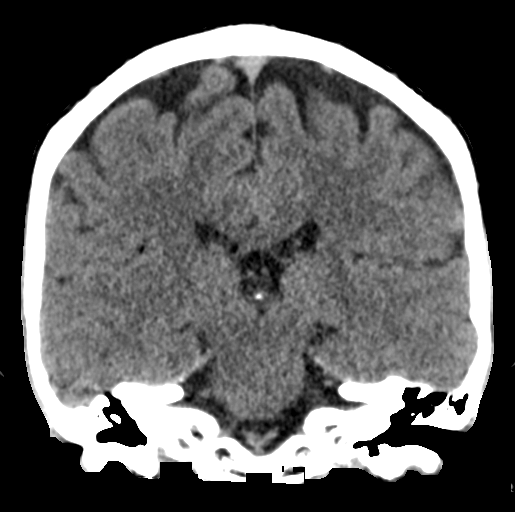

[Series 6: sagittal soft · sagittal · 0.31mm/px · 3 of 53 slices shown]
[im 18/53  brain]
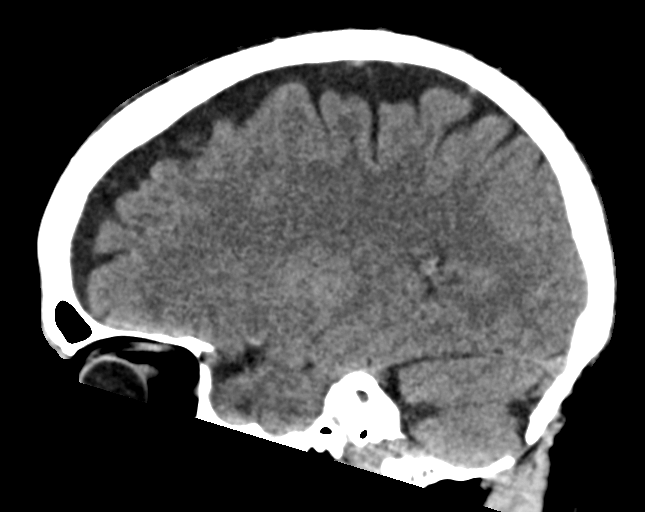
[im 27/53  brain]
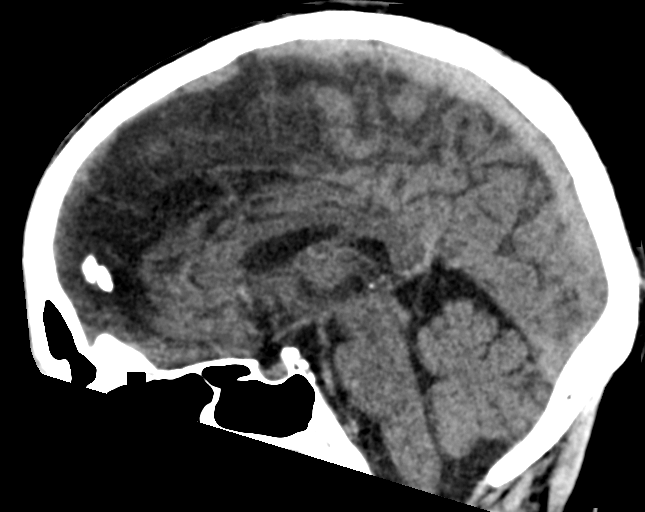
[im 35/53  brain]
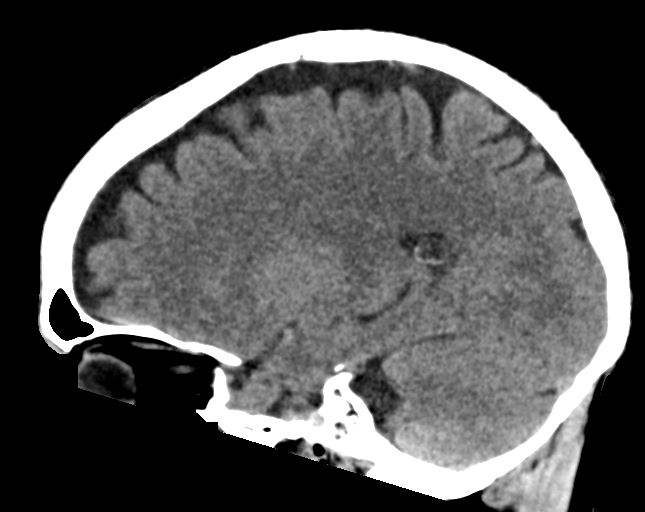

[15 of 47 positions shown; findings below may reference images not displayed]

FINDINGS: Brain: Prior surgery with subjacent encephalomalacia at the right
cerebellum. Small chronic appearing infarct at the left internal
capsule. No evidence of acute infarct, hemorrhage, hydrocephalus, or
mass.

Vascular: No hyperdense vessel or unexpected calcification.

Skull: Normal. Negative for fracture or focal lesion.

Sinuses/Orbits: No acute finding.

Other: These results were called by telephone at the time of
interpretation on 05/07/2021 at [DATE] to provider JACE SALVATORE ,
who verbally acknowledged these results.

ASPECTS ([place] Stroke Program Early CT Score)

Not scored with this history
IMPRESSION: 1. No acute finding.
2. Chronic lacunar infarct at the left internal capsule. Right
cerebellar encephalomalacia adjacent to a craniotomy.

## 2022-06-05 DIAGNOSIS — Z9189 Other specified personal risk factors, not elsewhere classified: Secondary | ICD-10-CM | POA: Diagnosis not present

## 2022-06-05 DIAGNOSIS — Z5181 Encounter for therapeutic drug level monitoring: Secondary | ICD-10-CM | POA: Diagnosis not present

## 2022-06-05 DIAGNOSIS — B20 Human immunodeficiency virus [HIV] disease: Secondary | ICD-10-CM | POA: Diagnosis not present

## 2022-06-05 DIAGNOSIS — Z23 Encounter for immunization: Secondary | ICD-10-CM | POA: Diagnosis not present

## 2022-06-05 DIAGNOSIS — Z79899 Other long term (current) drug therapy: Secondary | ICD-10-CM | POA: Diagnosis not present

## 2022-06-10 ENCOUNTER — Ambulatory Visit (HOSPITAL_BASED_OUTPATIENT_CLINIC_OR_DEPARTMENT_OTHER): Payer: BC Managed Care – PPO | Admitting: Family Medicine

## 2022-06-10 ENCOUNTER — Ambulatory Visit (HOSPITAL_BASED_OUTPATIENT_CLINIC_OR_DEPARTMENT_OTHER): Payer: BC Managed Care – PPO

## 2022-06-17 ENCOUNTER — Encounter: Payer: Self-pay | Admitting: Family Medicine

## 2022-06-17 ENCOUNTER — Ambulatory Visit (HOSPITAL_BASED_OUTPATIENT_CLINIC_OR_DEPARTMENT_OTHER): Payer: BC Managed Care – PPO | Admitting: Family Medicine

## 2022-06-17 ENCOUNTER — Ambulatory Visit: Payer: BC Managed Care – PPO | Admitting: Family Medicine

## 2022-06-17 VITALS — BP 109/70 | HR 87 | Temp 97.9°F | Ht 66.0 in | Wt 153.0 lb

## 2022-06-17 DIAGNOSIS — Z8719 Personal history of other diseases of the digestive system: Secondary | ICD-10-CM | POA: Diagnosis not present

## 2022-06-17 DIAGNOSIS — Z8673 Personal history of transient ischemic attack (TIA), and cerebral infarction without residual deficits: Secondary | ICD-10-CM

## 2022-06-17 DIAGNOSIS — G4709 Other insomnia: Secondary | ICD-10-CM | POA: Insufficient documentation

## 2022-06-17 DIAGNOSIS — R1013 Epigastric pain: Secondary | ICD-10-CM

## 2022-06-17 DIAGNOSIS — D7589 Other specified diseases of blood and blood-forming organs: Secondary | ICD-10-CM

## 2022-06-17 DIAGNOSIS — K222 Esophageal obstruction: Secondary | ICD-10-CM

## 2022-06-17 DIAGNOSIS — E291 Testicular hypofunction: Secondary | ICD-10-CM

## 2022-06-17 DIAGNOSIS — I10 Essential (primary) hypertension: Secondary | ICD-10-CM | POA: Diagnosis not present

## 2022-06-17 DIAGNOSIS — H53461 Homonymous bilateral field defects, right side: Secondary | ICD-10-CM

## 2022-06-17 DIAGNOSIS — Z1211 Encounter for screening for malignant neoplasm of colon: Secondary | ICD-10-CM

## 2022-06-17 DIAGNOSIS — I6381 Other cerebral infarction due to occlusion or stenosis of small artery: Secondary | ICD-10-CM

## 2022-06-17 DIAGNOSIS — B20 Human immunodeficiency virus [HIV] disease: Secondary | ICD-10-CM

## 2022-06-17 DIAGNOSIS — F339 Major depressive disorder, recurrent, unspecified: Secondary | ICD-10-CM

## 2022-06-17 DIAGNOSIS — Z1212 Encounter for screening for malignant neoplasm of rectum: Secondary | ICD-10-CM

## 2022-06-17 DIAGNOSIS — D751 Secondary polycythemia: Secondary | ICD-10-CM

## 2022-06-17 DIAGNOSIS — F411 Generalized anxiety disorder: Secondary | ICD-10-CM

## 2022-06-17 MED ORDER — "NEEDLE (DISP) 25G X 1"" MISC": 3 refills | Status: DC

## 2022-06-17 MED ORDER — ASPIRIN 81 MG PO TBEC: 81.0000 mg | DELAYED_RELEASE_TABLET | Freq: Every day | ORAL | Status: AC

## 2022-06-17 MED ORDER — OMEPRAZOLE 20 MG PO CPDR: 20.0000 mg | DELAYED_RELEASE_CAPSULE | Freq: Every day | ORAL | 3 refills | Status: DC

## 2022-06-17 MED ORDER — ZOLPIDEM TARTRATE 10 MG PO TABS: 10.0000 mg | ORAL_TABLET | Freq: Every evening | ORAL | 2 refills | Status: DC | PRN

## 2022-06-17 NOTE — Progress Notes (Signed)
Subjective  CC:  Chief Complaint  Patient presents with   Establish Care    Pt has no questions or concerns     HPI: Bradley Chandler is a 52 y.o. male who presents to [facility] [employer] today to establish care with me as a new patient.  I have reviewed data/chart including multilple records from infectious disease including lab results over multiple years, immunization history, ID evaluations; and hospitalizations including 04/2021 for visual disturbance: h&P, DS, labs, brain MRI, brain CT, echocardiogram; GI evaluation from 2015 including egd and biopsy results. Also has seen hand surgeon and ortho; I personally reviewed all notes and updated our chart records including PL, PMH, imms, and HM.  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 about 5-6 years ago. Has multiple medical problems reviewed below.  HIV disease: contracted in college. Treated since 1997 and well controlled. Sees ID. Normal immunity.  H/o meningitis 1997 w/o secondary basal ganglia infarct seen on recent MRI. S/p craniotomy at that time for exploratory purposes. No long term neurologic deficits.  04/2021: right homonymous hemianopia w/ inpatient evaluation. Dx: TIA vs migraine equivalent vs other. Brain MRI unremarkable as was 2d echocardiogram/ heart monitor/ and carotid dopplers with nonobstructive disease. Was recommended to have further eval with transcranial dopplers and bubble study but never had f/u (neurologist was not a good fit for him), transthoracic echocardiogram and LE dopplers. He remains on asa '81mg'$  daily but does not take a statin. Last lipids 2022.  Htn: has been well controlled for about a decade: low dose carvedilol and hctz. No complications. No sob, cp or edema. Physically fit and exercises regularly.  GAD: long standing. Now fairly well controlled on wellbutrin '450mg'$  daily although  feels did just as well on '300mg'$  daily. Seeing psych. Uses ativan as needed for anxiety related sleep d/o. I reviewed psych notes and treatment plans. Failed duloxetine. Stress triggers: high stress career and stressful relationship. Associated with chronic depression. Will isolate and have low motivation. Exercise and testosterone replacement has helped with those sxs.  H/o gastritis by egd 2015. C/o 1-3 months of midepigastric burning pain after meals. Occ tums use. No melena or hematemesis. 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 Also dissatisfied with life here in [place] overall. Hasn't been on PPI. No ruq or Luq pain. Healthy diet. Rare nsaid use. Social drinker. Has h/o schatzkis ring; denies dysphagia now.  Low T: treated by prior pcp. Reviewed testosterone levels. Last checked in October and dose was increased. Now on t 100 weekly. Recent cbc by ID shows hgb 18 with mcv > 100. He feels better: more energy, less low mood, better libido.  HM: due for CRC screen. Imms are current. Reviewed all.   Assessment  1. Essential hypertension   2. Basal ganglia infarction ([place])   3. H/O gastritis   4. Schatzki's ring   5. Transient homonymous hemianopia, right   6. HIV disease ([place])   7. GAD (generalized anxiety disorder)   8. Hypogonadism in male   77. Screening for colorectal cancer   10. Macrocytosis   11. Polycythemia   12. Epigastric pain   13. Secondary insomnia   14. Major depression, recurrent, chronic (HCC)      Plan  HTN is controlled.  H/o CVA: check lipids. Continue asa H/o homonymous hemianopia: will need to consider further  evaluation. Continue asa. No further sxs. No migraines as an adult. He is not completely opposed to further eval.  Epigastric pain: suspect stress induced gastritis, recurrent. Start prilosec 20 daily. To GI if can't get controlled.  GAD/depression; can decrease wellbutrin down to 300 again and monitor. Continue with psych. Continue with  prn ativan Secondary insomnia: ambien prn since has worked in past. But agree, could consider tricyclics. Discussion started.  Hypogonadism with polycythemia; due for testosterone recheck. Discussed risks of testosterone replacement is possible setting of prior cva (although not thought to be ischemic).  R/o b12 and folate deficiency HIV per ID. Remains stable.  Pt to return for fasting labs.  HM: refer to GI for colonoscopy.   I spent a total of 62 minutes for this patient encounter. Time spent included preparation (30 minutes of detailed chart/record review), 45 minutes of  face-to-face counseling with the patient and coordination of care, review of chart and records, and documentation of the encounter.   Follow up:  6 months for htn f/u Orders Placed This Encounter  Procedures   CBC with Differential/Platelet   B12 and Folate Panel   Lipid panel   Lipase   Testosterone,Free and Total   Comprehensive metabolic panel   TSH   Ambulatory referral to Gastroenterology   Meds ordered this encounter  Medications   zolpidem (AMBIEN) 10 MG tablet    Sig: Take 1 tablet (10 mg total) by mouth at bedtime as needed for sleep.    Dispense:  30 tablet    Refill:  2   NEEDLE, DISP, 25 G 25G X 1" MISC    Sig: Use as directed    Dispense:  25 each    Refill:  3   omeprazole (PRILOSEC) 20 MG capsule    Sig: Take 1 capsule (20 mg total) by mouth daily.    Dispense:  30 capsule    Refill:  3        03/19/2022    2:57 PM 01/16/2022    9:29 AM 09/10/2021    9:23 AM 05/16/2021   12:27 PM  Depression screen PHQ 2/9  Decreased Interest 0 0 1 2  Down, Depressed, Hopeless 0 0 1 2  PHQ - 2 Score 0 0 2 4  Altered sleeping 0 0 2 2  Tired, decreased energy 0 0 0 2  Change in appetite 0 0 0 0  Feeling bad or failure about yourself  0 0 0 1  Trouble concentrating 0 0 0 1  Moving slowly or fidgety/restless 0 0 0 0  Suicidal thoughts 0 0 0 0  PHQ-9 Score 0 0 4 10  Difficult doing work/chores Not  difficult at all Not difficult at all Not difficult at all Somewhat difficult    We updated and reviewed the patient's past history in detail and it is documented below.  Patient Active Problem List   Diagnosis Date Noted   Hypogonadism in male 01/16/2022    Priority: High   Transient homonymous hemianopia, right 05/16/2021    Priority: High    04/2021; admitted and neurology consulted. Brain MRI w/o associated acute findings. Never completed full recommended workup (transcranial doppler w/ bubble study, lower ext dopplers). Recommended asa or plavix and statin.     Basal ganglia infarction ([place]) 05/07/2021    Priority: High    Brain MRI 04/2021: LEFT basal ganglia lacunar infarction, undetermined age, h/o meningitis as effect of early HIV disease putatively thought to be the cause Carotid dopplers  04/2021: Atherosclerotic disease of LEFT carotid bifurcation 04/2021: acute Homonymous hemianopia w/o change to account for it on brain MRI: declines further eval, asa or plavix at that time.     HIV disease ([place]) 10/10/2015    Priority: High    Dxd 1997; well controlled w/o detectable RNA. Followed by [facility] ID    GAD (generalized anxiety disorder) 10/10/2015    Priority: High    Wellbutrin success: failed prazosin due to hangover next day and duloxetine; prn ativan. [facility] psychiatrist at [facility] 2023    Essential hypertension 10/10/2015    Priority: High   H/O gastritis 06/17/2022    Priority: Medium     EGD 2015, [facility], gastritis with hemorrhage, Schatzki's rings    Secondary insomnia 06/17/2022    Priority: Medium     GAD; responsive to prn ativan or ambien; has failed trazodone due to side effects. Never tried tricyclics or lunesta    Schatzki's ring 10/10/2015    Priority: Medium     EGD [facility] 2015    Morton's neuroma, left 10/10/2015    Priority: Medium    Seasonal allergies 10/10/2015    Priority: Low   Major depression, recurrent, chronic ([place]) 06/17/2022   Research study  patient 04/24/2017   Health Maintenance  Topic Date Due   COLONOSCOPY (Pts 45-25yr Insurance coverage will need to be confirmed)  Never done   COVID-19 Vaccine (6 - 2023-24 season) 04/25/2022   Zoster Vaccines- Shingrix (2 of 2) 07/31/2022   DTaP/Tdap/Td (2 - Td or Tdap) 09/19/2026   INFLUENZA VACCINE  Completed   Hepatitis C Screening  Completed   HIV Screening  Completed   HPV VACCINES  Aged Out   Immunization History  Administered Date(s) Administered   Influenza,inj,Quad PF,6+ Mos 02/12/2017, 02/28/2022   Influenza,inj,quad, With Preservative 01/24/2018, 02/24/2019   Influenza-Unspecified 03/11/2015, 02/27/2016, 01/24/2018, 03/16/2020, 02/23/2021, 03/05/2022   Meningococcal Mcv4o 01/02/2022, 06/05/2022   Moderna Covid-19 Vaccine Bivalent Booster 166yr& up 02/28/2022   PFIZER Comirnaty(Gray Top)Covid-19 Tri-Sucrose Vaccine 09/02/2020   PFIZER(Purple Top)SARS-COV-2 Vaccination 08/10/2019, 08/31/2019, 03/16/2020   Pneumococcal Conjugate-13 03/20/2016   Pneumococcal Polysaccharide-23 09/18/2016   Tdap 09/18/2016   Vaccinia,smallpox Monkeypox Vaccine Live,pf 01/24/2021, 02/22/2021   Zoster Recombinat (Shingrix) 06/05/2022   Current Meds  Medication Sig   buPROPion HCl (WELLBUTRIN XL PO) Take 450 mg by mouth daily.   carvedilol (COREG) 6.25 MG tablet Take 6.25 mg by mouth 2 (two) times daily with a meal.   diphenoxylate-atropine (LOMOTIL) 2.5-0.025 MG tablet TAKE 1-2 TABLETS BY MOUTH FOUR TIMES DAILY AS NEEDED DIARRHEA   Efavirenz-lamiVUDine-Tenofovir 600-300-300 MG TABS Take 1 tablet by mouth daily.   fluticasone (FLONASE) 50 MCG/ACT nasal spray Place 2 sprays into both nostrils daily.   hydrochlorothiazide (HYDRODIURIL) 12.5 MG tablet    LORazepam (ATIVAN) 0.5 MG tablet Take by mouth.   NEEDLE, DISP, 25 G 25G X 1" MISC Use as directed   omeprazole (PRILOSEC) 20 MG capsule Take 1 capsule (20 mg total) by mouth daily.   Syringe, Disposable, 3 ML MISC 1 each by Does not apply  route once a week.   testosterone cypionate (DEPOTESTOSTERONE CYPIONATE) 200 MG/ML injection Inject 1 mL (200 mg total) into the muscle once a week.   zolpidem (AMBIEN) 10 MG tablet Take 1 tablet (10 mg total) by mouth at bedtime as needed for sleep.   [DISCONTINUED] NEEDLE, DISP, 22 G 22G X 1-1/2" MISC 1 each by Does not apply route once a week.    Allergies: Patient  has No Known Allergies. Past Medical History Patient  has a past medical history of Benign essential HTN (10/10/2015), Blood transfusion without reported diagnosis (1997), GAD (generalized anxiety disorder) (10/10/2015), History of esophagogastroduodenoscopy (EGD) (10/10/2015), History of kidney stones, HIV disease ([place]) (10/10/2015), Neuroma, Pneumonia, Schatzki's ring (10/10/2015), Seasonal allergies (10/10/2015), Situational depression (10/10/2015), and Stroke ([place]). Past Surgical History Patient  has a past surgical history that includes Brain surgery; Fracture surgery (2021); Incision and drainage abscess (Left, 03/21/2022); and Co2 laser application (Left, 39/07/90). Family History: Patient family history includes Anxiety disorder in his father and maternal grandmother; Arthritis in his mother; Coronary artery disease in his father; Depression in his father and maternal grandmother; Hypertension in his father and paternal grandfather; Stroke in his father. Social History:  Patient  reports that he quit smoking about 14 years ago. His smoking use included cigarettes. He smoked an average of .25 packs per day. He has never used smokeless tobacco. He reports current alcohol use of about 2.0 standard drinks of alcohol per week. He reports that he does not use drugs.  Review of Systems: Constitutional: negative for fever or malaise Ophthalmic: negative for photophobia, double vision or loss of vision Cardiovascular: negative for chest pain, dyspnea on exertion, or new LE swelling Respiratory: negative for SOB or persistent  cough Gastrointestinal: + for abdominal pain without change in bowel habits or melena Genitourinary: negative for dysuria or gross hematuria Musculoskeletal: negative for new gait disturbance or muscular weakness Integumentary: negative for new or persistent rashes Neurological: negative for TIA or stroke symptoms Psychiatric: negative for SI or delusions Allergic/Immunologic: negative for hives  Patient Care Team    Relationship Specialty Notifications Start End  Leamon Arnt, MD PCP - General Family Medicine  06/17/22   Elon Spanner, MD Consulting Physician Infectious Diseases  06/17/22    Comment: [facility]  Iran Planas, MD Consulting Physician Orthopedic Surgery  06/17/22     Objective  Vitals: BP 109/70 (BP Location: Right Arm, Patient Position: Sitting)   Pulse 87   Temp 97.9 F (36.6 C) (Temporal)   Ht '5\' 6"'$  (1.676 m)   Wt 153 lb (69.4 kg)   SpO2 95%   BMI 24.69 kg/m  General:  Well developed, well nourished, no acute distress  Psych:  Alert and oriented,normal mood and affect HEENT:  Normocephalic, atraumatic, non-icteric sclera, supple neck without adenopathy, mass or thyromegaly Cardiovascular:  RRR without gallop, rub or murmur Respiratory:  Good breath sounds bilaterally, CTAB with normal respiratory effort Gastrointestinal: normal bowel sounds, soft, + midepigastric ttp w/o rebound or guarding. No ruq or luq ttp, no noted masses. No HSM Neurologic:    Mental status is normal. Gross motor and sensory exams are normal. Normal gait  Commons side effects, risks, benefits, and alternatives for medications and treatment plan prescribed today were discussed, and the patient expressed understanding of the given instructions. Patient is instructed to call or message via MyChart if he/she has any questions or concerns regarding our treatment plan. No barriers to understanding were identified. We discussed Red Flag symptoms and signs in detail. Patient expressed  understanding regarding what to do in case of urgent or emergency type symptoms.  Medication list was reconciled, printed and provided to the patient in AVS. Patient instructions and summary information was reviewed with the patient as documented in the AVS. This note was prepared with assistance of Dragon voice recognition software. Occasional wrong-word or sound-a-like substitutions may have occurred due to the inherent limitations of voice  recognition software  This visit occurred during the SARS-CoV-2 public health emergency.  Safety protocols were in place, including screening questions prior to the visit, additional usage of staff PPE, and extensive cleaning of exam room while observing appropriate contact time as indicated for disinfecting solutions.

## 2022-06-17 NOTE — Patient Instructions (Signed)
Please return in 6 months to recheck blood pressure.  Also schedule a lab visit; please come fasting.   We will call you to get you set up for a colonoscopy consultation with [facility].   If you have any questions or concerns, please don't hesitate to send me a message via MyChart or call the office at [phone]. Thank you for visiting with Bradley Chandler today! It's our pleasure caring for you.

## 2022-06-18 ENCOUNTER — Telehealth: Payer: Self-pay | Admitting: Gastroenterology

## 2022-06-18 NOTE — Telephone Encounter (Signed)
Good morning Dr. Candis Schatz.   We have received a referral from [facility] for a colonoscopy. He is transferring to [place] from [place]. He no longer wants to go there because it is too far away. Records are in Epic. Please review and advise of scheduling. Thank you.

## 2022-06-27 ENCOUNTER — Other Ambulatory Visit (INDEPENDENT_AMBULATORY_CARE_PROVIDER_SITE_OTHER): Payer: BC Managed Care – PPO

## 2022-06-27 DIAGNOSIS — Z8719 Personal history of other diseases of the digestive system: Secondary | ICD-10-CM

## 2022-06-27 DIAGNOSIS — H53461 Homonymous bilateral field defects, right side: Secondary | ICD-10-CM | POA: Diagnosis not present

## 2022-06-27 DIAGNOSIS — D7589 Other specified diseases of blood and blood-forming organs: Secondary | ICD-10-CM | POA: Diagnosis not present

## 2022-06-27 DIAGNOSIS — R1013 Epigastric pain: Secondary | ICD-10-CM

## 2022-06-27 DIAGNOSIS — I10 Essential (primary) hypertension: Secondary | ICD-10-CM

## 2022-06-27 DIAGNOSIS — E291 Testicular hypofunction: Secondary | ICD-10-CM | POA: Diagnosis not present

## 2022-06-27 DIAGNOSIS — D751 Secondary polycythemia: Secondary | ICD-10-CM

## 2022-06-27 DIAGNOSIS — F411 Generalized anxiety disorder: Secondary | ICD-10-CM

## 2022-06-27 DIAGNOSIS — I6381 Other cerebral infarction due to occlusion or stenosis of small artery: Secondary | ICD-10-CM | POA: Diagnosis not present

## 2022-06-27 LAB — B12 AND FOLATE PANEL
Folate: 5.4 ng/mL — ABNORMAL LOW
Vitamin B-12: 194 pg/mL — ABNORMAL LOW (ref 211–911)

## 2022-06-27 LAB — CBC WITH DIFFERENTIAL/PLATELET
Basophils Absolute: 0 10*3/uL (ref 0.0–0.1)
Basophils Relative: 0.7 % (ref 0.0–3.0)
Eosinophils Absolute: 0.2 10*3/uL (ref 0.0–0.7)
Eosinophils Relative: 5.7 % — ABNORMAL HIGH (ref 0.0–5.0)
HCT: 49.3 % (ref 39.0–52.0)
Hemoglobin: 17 g/dL (ref 13.0–17.0)
Lymphocytes Relative: 42.4 % (ref 12.0–46.0)
Lymphs Abs: 1.7 10*3/uL (ref 0.7–4.0)
MCHC: 34.6 g/dL (ref 30.0–36.0)
MCV: 100.7 fl — ABNORMAL HIGH (ref 78.0–100.0)
Monocytes Absolute: 0.4 10*3/uL (ref 0.1–1.0)
Monocytes Relative: 8.8 % (ref 3.0–12.0)
Neutro Abs: 1.7 10*3/uL (ref 1.4–7.7)
Neutrophils Relative %: 42.4 % — ABNORMAL LOW (ref 43.0–77.0)
Platelets: 253 10*3/uL (ref 150.0–400.0)
RBC: 4.89 Mil/uL (ref 4.22–5.81)
RDW: 13.7 % (ref 11.5–15.5)
WBC: 4.1 10*3/uL (ref 4.0–10.5)

## 2022-06-27 LAB — COMPREHENSIVE METABOLIC PANEL WITH GFR
ALT: 28 U/L (ref 0–53)
AST: 31 U/L (ref 0–37)
Albumin: 4.6 g/dL (ref 3.5–5.2)
Alkaline Phosphatase: 59 U/L (ref 39–117)
BUN: 15 mg/dL (ref 6–23)
CO2: 29 meq/L (ref 19–32)
Calcium: 9.6 mg/dL (ref 8.4–10.5)
Chloride: 105 meq/L (ref 96–112)
Creatinine, Ser: 1.15 mg/dL (ref 0.40–1.50)
GFR: 74.31 mL/min
Glucose, Bld: 100 mg/dL — ABNORMAL HIGH (ref 70–99)
Potassium: 4.4 meq/L (ref 3.5–5.1)
Sodium: 144 meq/L (ref 135–145)
Total Bilirubin: 0.5 mg/dL (ref 0.2–1.2)
Total Protein: 6.7 g/dL (ref 6.0–8.3)

## 2022-06-27 LAB — LIPID PANEL
Cholesterol: 174 mg/dL (ref 0–200)
HDL: 35.8 mg/dL — ABNORMAL LOW
LDL Cholesterol: 110 mg/dL — ABNORMAL HIGH (ref 0–99)
NonHDL: 138
Total CHOL/HDL Ratio: 5
Triglycerides: 139 mg/dL (ref 0.0–149.0)
VLDL: 27.8 mg/dL (ref 0.0–40.0)

## 2022-06-27 LAB — TSH: TSH: 0.94 u[IU]/mL (ref 0.35–5.50)

## 2022-06-27 LAB — LIPASE: Lipase: 36 U/L (ref 11.0–59.0)

## 2022-07-06 ENCOUNTER — Encounter: Payer: Self-pay | Admitting: Family Medicine

## 2022-07-07 LAB — TESTOSTERONE,FREE AND TOTAL
Testosterone, Free: 11.5 pg/mL (ref 7.2–24.0)
Testosterone: 578 ng/dL (ref 264–916)

## 2022-07-07 MED ORDER — CYCLOBENZAPRINE HCL 10 MG PO TABS: 10.0000 mg | ORAL_TABLET | Freq: Every evening | ORAL | 1 refills | Status: DC | PRN

## 2022-07-22 ENCOUNTER — Telehealth: Payer: BC Managed Care – PPO | Admitting: Nurse Practitioner

## 2022-07-22 DIAGNOSIS — U071 COVID-19: Secondary | ICD-10-CM

## 2022-07-22 MED ORDER — NIRMATRELVIR/RITONAVIR (PAXLOVID)TABLET: 3.0000 | ORAL_TABLET | Freq: Two times a day (BID) | ORAL | 0 refills | Status: AC

## 2022-07-22 MED ORDER — MOLNUPIRAVIR EUA 200MG CAPSULE: 4.0000 | ORAL_CAPSULE | Freq: Two times a day (BID) | ORAL | 0 refills | Status: AC

## 2022-07-22 NOTE — Progress Notes (Signed)
Virtual Visit Consent   Bradley Chandler, you are scheduled for a virtual visit with a [facility] provider today. Just as with appointments in the office, your consent must be obtained to participate. Your consent will be active for this visit and any virtual visit you may have with one of our providers in the next 365 days. If you have a MyChart account, a copy of this consent can be sent to you electronically.  As this is a virtual visit, video technology does not allow for your provider to perform a traditional examination. This may limit your provider's ability to fully assess your condition. If your provider identifies any concerns that need to be evaluated in person or the need to arrange testing (such as labs, EKG, etc.), we will make arrangements to do so. Although advances in technology are sophisticated, we cannot ensure that it will always work on either your end or our end. If the connection with a video visit is poor, the visit may have to be switched to a telephone visit. With either a video or telephone visit, we are not always able to ensure that we have a secure connection.  By engaging in this virtual visit, you consent to the provision of healthcare and authorize for your insurance to be billed (if applicable) for the services provided during this visit. Depending on your insurance coverage, you may receive a charge related to this service.  I need to obtain your verbal consent now. Are you willing to proceed with your visit today? Bradley Chandler has provided verbal consent on 07/22/2022 for a virtual visit (video or telephone). Apolonio Schneiders, FNP  Date: 07/22/2022 7:26 AM  Virtual Visit via Video Note   I, Apolonio Schneiders, connected with  Bradley Chandler  ([id number], 08/11/1970) on 07/22/22 at  9:00 AM EST by a video-enabled telemedicine application and verified that I am speaking with the correct person using two identifiers.  Location: Patient: Virtual Visit Location Patient:  Home Provider: Virtual Visit Location Provider: Home Office   I discussed the limitations of evaluation and management by telemedicine and the availability of in person appointments. The patient expressed understanding and agreed to proceed.    History of Present Illness: Bradley Chandler is a 52 y.o. who identifies as a male who was assigned male at birth, and is being seen today for COVID-19.  Tested positive this morning  Symptoms include: slight cough and sore throat  Symptom onset yesterday   He has not had COVID in the past  He has been vaccinated including most recent booster   History includes HTN, DM, denies a respiratory history or need for inhalers  He has had pneumonia once in the past   GFR 74.31 from this month   Problems:  Patient Active Problem List   Diagnosis Date Noted   H/O gastritis 06/17/2022   Secondary insomnia 06/17/2022   Major depression, recurrent, chronic ([place]) 06/17/2022   Hypogonadism in male 01/16/2022   Transient homonymous hemianopia, right 05/16/2021   Basal ganglia infarction ([place]) 05/07/2021   Research study patient 04/24/2017   HIV disease ([place]) 10/10/2015   Schatzki's ring 10/10/2015   Morton's neuroma, left 10/10/2015   GAD (generalized anxiety disorder) 10/10/2015   Essential hypertension 10/10/2015   Seasonal allergies 10/10/2015    Allergies: No Known Allergies Medications:  Current Outpatient Medications:    aspirin EC 81 MG tablet, Take 1 tablet (81 mg total) by mouth daily., Disp: , Rfl:    buPROPion HCl (WELLBUTRIN XL PO),  Take 450 mg by mouth daily., Disp: , Rfl:    carvedilol (COREG) 6.25 MG tablet, Take 6.25 mg by mouth 2 (two) times daily with a meal., Disp: , Rfl:    cyclobenzaprine (FLEXERIL) 10 MG tablet, Take 1 tablet (10 mg total) by mouth at bedtime as needed for muscle spasms., Disp: 90 tablet, Rfl: 1   diphenoxylate-atropine (LOMOTIL) 2.5-0.025 MG tablet, TAKE 1-2 TABLETS BY MOUTH FOUR TIMES DAILY AS NEEDED DIARRHEA,  Disp: , Rfl:    Efavirenz-lamiVUDine-Tenofovir 600-300-300 MG TABS, Take 1 tablet by mouth daily., Disp: , Rfl:    fluticasone (FLONASE) 50 MCG/ACT nasal spray, Place 2 sprays into both nostrils daily., Disp: , Rfl:    hydrochlorothiazide (HYDRODIURIL) 12.5 MG tablet, , Disp: , Rfl:    LORazepam (ATIVAN) 0.5 MG tablet, Take by mouth., Disp: , Rfl:    NEEDLE, DISP, 25 G 25G X 1" MISC, Use as directed, Disp: 25 each, Rfl: 3   omeprazole (PRILOSEC) 20 MG capsule, Take 1 capsule (20 mg total) by mouth daily., Disp: 30 capsule, Rfl: 3   Syringe, Disposable, 3 ML MISC, 1 each by Does not apply route once a week., Disp: 25 each, Rfl: 1   testosterone cypionate (DEPOTESTOSTERONE CYPIONATE) 200 MG/ML injection, Inject 1 mL (200 mg total) into the muscle once a week., Disp: 10 mL, Rfl: 0   zolpidem (AMBIEN) 10 MG tablet, Take 1 tablet (10 mg total) by mouth at bedtime as needed for sleep., Disp: 30 tablet, Rfl: 2  Observations/Objective: Patient is well-developed, well-nourished in no acute distress.  Resting comfortably  at home.  Head is normocephalic, atraumatic.  No labored breathing.  Speech is clear and coherent with logical content.  Patient is alert and oriented at baseline.    Assessment and Plan: 1. COVID-19 Preferred medication Paxlovid- patient will discuss with pharmacy regarding current anti-viral medication and drug/drug. Lagevrio called in as alternative to Paxlovid.  Due to current copay and cost of Lagevrio will try to move forward with Paxlovid if agreeable with pharmacy   - molnupiravir EUA (LAGEVRIO) 200 mg CAPS capsule; Take 4 capsules (800 mg total) by mouth 2 (two) times daily for 5 days.  Dispense: 40 capsule; Refill: 0 - nirmatrelvir/ritonavir (PAXLOVID) 20 x 150 MG & 10 x '100MG'$  TABS; Take 3 tablets by mouth 2 (two) times daily for 5 days. (Take nirmatrelvir 150 mg two tablets twice daily for 5 days and ritonavir 100 mg one tablet twice daily for 5 days) Patient GFR is 74   Dispense: 30 tablet; Refill: 0    Discussed symptom management with OTC [place] HBP as needed  Push fluids  Rest Assure caloric intake   Follow Up Instructions: I discussed the assessment and treatment plan with the patient. The patient was provided an opportunity to ask questions and all were answered. The patient agreed with the plan and demonstrated an understanding of the instructions.  A copy of instructions were sent to the patient via MyChart unless otherwise noted below.    The patient was advised to call back or seek an in-person evaluation if the symptoms worsen or if the condition fails to improve as anticipated.  Time:  I spent 11 minutes with the patient via telehealth technology discussing the above problems/concerns.    Apolonio Schneiders, FNP

## 2022-07-31 ENCOUNTER — Encounter: Payer: Self-pay | Admitting: Gastroenterology

## 2022-08-22 ENCOUNTER — Encounter: Payer: Self-pay | Admitting: Family Medicine

## 2022-08-22 DIAGNOSIS — E291 Testicular hypofunction: Secondary | ICD-10-CM

## 2022-08-25 ENCOUNTER — Other Ambulatory Visit: Payer: Self-pay

## 2022-08-25 DIAGNOSIS — E291 Testicular hypofunction: Secondary | ICD-10-CM

## 2022-08-25 MED ORDER — TESTOSTERONE CYPIONATE 200 MG/ML IM SOLN: 200.0000 mg | INTRAMUSCULAR | 4 refills | Status: DC

## 2022-09-01 ENCOUNTER — Ambulatory Visit (AMBULATORY_SURGERY_CENTER): Payer: BC Managed Care – PPO

## 2022-09-01 ENCOUNTER — Encounter: Payer: Self-pay | Admitting: Gastroenterology

## 2022-09-01 VITALS — Ht 66.0 in | Wt 148.0 lb

## 2022-09-01 DIAGNOSIS — Z1211 Encounter for screening for malignant neoplasm of colon: Secondary | ICD-10-CM

## 2022-09-01 MED ORDER — NA SULFATE-K SULFATE-MG SULF 17.5-3.13-1.6 GM/177ML PO SOLN: 1.0000 | Freq: Once | ORAL | 0 refills | Status: AC

## 2022-09-01 NOTE — Progress Notes (Signed)

## 2022-09-02 ENCOUNTER — Other Ambulatory Visit: Payer: Self-pay | Admitting: Family Medicine

## 2022-09-05 ENCOUNTER — Encounter (INDEPENDENT_AMBULATORY_CARE_PROVIDER_SITE_OTHER): Payer: BC Managed Care – PPO | Admitting: Family Medicine

## 2022-09-05 DIAGNOSIS — L708 Other acne: Secondary | ICD-10-CM

## 2022-09-06 MED ORDER — DOXYCYCLINE HYCLATE 100 MG PO TABS: 100.0000 mg | ORAL_TABLET | Freq: Two times a day (BID) | ORAL | 0 refills | Status: AC

## 2022-09-06 MED ORDER — ADAPALENE 0.1 % EX CREA: TOPICAL_CREAM | Freq: Every day | CUTANEOUS | 0 refills | Status: DC

## 2022-09-06 NOTE — Telephone Encounter (Signed)
Spoke to patient: has acne breakout on back x about a month. Likely due to testosterone. No pain or vesicles. Some pustules, mostly upper back.  Rec course of doxy and then differen cream as needed. Medications sentin.   A total of 6 minutes were spent by me to personally review the patient-generated inquiry, review patient records and data pertinent to assessment of the patient's problem, develop a management plan including generation of prescriptions and/or orders, and on subsequent communication with the patient through secure the MyChart portal service. There is no separately reported E/M service related to this service in the past 7 days nor does the patient have an upcoming soonest available appointment for this issue. This work was completed in less than 7 days.

## 2022-09-22 ENCOUNTER — Ambulatory Visit (AMBULATORY_SURGERY_CENTER): Payer: BC Managed Care – PPO | Admitting: Gastroenterology

## 2022-09-22 ENCOUNTER — Encounter: Payer: Self-pay | Admitting: Gastroenterology

## 2022-09-22 VITALS — BP 115/69 | HR 67 | Temp 97.8°F | Resp 16 | Ht 66.0 in | Wt 148.0 lb

## 2022-09-22 DIAGNOSIS — D123 Benign neoplasm of transverse colon: Secondary | ICD-10-CM | POA: Diagnosis not present

## 2022-09-22 DIAGNOSIS — Z1211 Encounter for screening for malignant neoplasm of colon: Secondary | ICD-10-CM | POA: Diagnosis not present

## 2022-09-22 MED ORDER — SODIUM CHLORIDE 0.9 % IV SOLN
500.0000 mL | Freq: Once | INTRAVENOUS | Status: DC
Start: 2022-09-22 — End: 2022-09-22

## 2022-09-22 NOTE — Progress Notes (Signed)
VS by DT  Pt's states no medical or surgical changes since previsit or office visit.  

## 2022-09-22 NOTE — Progress Notes (Signed)
Uneventful anesthetic. Report to pacu rn. Vss. Care resumed by rn. 

## 2022-09-22 NOTE — Progress Notes (Signed)
[facility] History and Physical   Primary Care Physician:  Willow Ora, MD   Reason for Procedure:   Colon cancer screening  Plan:    Screening colonoscopy     HPI: Bradley Chandler is a 52 y.o. male undergoing initial average risk screening colonoscopy.  He has no family history of colon cancer and no chronic GI symptoms.    Past Medical History:  Diagnosis Date   Benign essential HTN 10/10/2015   Blood transfusion without reported diagnosis 1997   GAD (generalized anxiety disorder) 10/10/2015   History of esophagogastroduodenoscopy (EGD) 10/10/2015   History of kidney stones    HIV disease (HCC) 10/10/2015   Neuroma    s/p craniotomy   Pneumonia    Schatzki's ring 10/10/2015   Seasonal allergies 10/10/2015   Situational depression 10/10/2015   Stroke [facility])     Past Surgical History:  Procedure Laterality Date   BRAIN SURGERY     CO2 LASER APPLICATION Left 03/21/2022   Procedure: CO2 LASER OF PENILE PAPULES;  Surgeon: Bjorn Pippin, MD;  Location: [facility];  Service: Urology;  Laterality: Left;   FRACTURE SURGERY  2021   Pinky finger; Rt hand   INCISION AND DRAINAGE ABSCESS Left 03/21/2022   Procedure: EXCISION OF LEFT INGUINAL ABSESS,EXCISION OF SUPRAPUBIC FOLLICULITIS;  Surgeon: Bjorn Pippin, MD;  Location: [facility];  Service: Urology;  Laterality: Left;    Prior to Admission medications   Medication Sig Start Date End Date Taking? Authorizing Provider  adapalene (DIFFERIN) 0.1 % cream Apply topically at bedtime. 09/06/22  Yes Willow Ora, MD  aspirin EC 81 MG tablet Take 1 tablet (81 mg total) by mouth daily. 06/17/22  Yes Willow Ora, MD  buPROPion HCl (WELLBUTRIN XL PO) Take 300 mg by mouth daily.   Yes [provider]  carvedilol (COREG) 6.25 MG tablet Take 6.25 mg by mouth 2 (two) times daily with a meal. 06/14/15  Yes [provider]  Cholecalciferol 25 MCG (1000 UT) capsule Take 1,000  Units by mouth daily. 10/04/20  Yes [provider]  Efavirenz-lamiVUDine-Tenofovir 600-300-300 MG TABS Take 1 tablet by mouth daily. 04/29/21  Yes [provider]  fluticasone (FLONASE) 50 MCG/ACT nasal spray Place 2 sprays into both nostrils daily. 05/28/22  Yes [provider]  FOLIC ACID PO Take 1,000 mg by mouth daily.   Yes [provider]  hydrochlorothiazide (HYDRODIURIL) 12.5 MG tablet  12/24/21  Yes [provider]  NEEDLE, DISP, 25 G 25G X 1" MISC Use as directed 06/17/22  Yes Willow Ora, MD  omeprazole (PRILOSEC) 20 MG capsule Take 1 capsule (20 mg total) by mouth daily. 09/02/22  Yes Willow Ora, MD  Syringe, Disposable, 3 ML MISC 1 each by Does not apply route once a week. 03/19/22  Yes de [place], Raymond J, MD  testosterone cypionate (DEPOTESTOSTERONE CYPIONATE) 200 MG/ML injection Inject 1 mL (200 mg total) into the muscle once a week. 08/25/22  Yes Willow Ora, MD  cyclobenzaprine (FLEXERIL) 10 MG tablet Take 1 tablet (10 mg total) by mouth at bedtime as needed for muscle spasms. 07/07/22   Willow Ora, MD  diphenoxylate-atropine (LOMOTIL) 2.5-0.025 MG tablet TAKE 1-2 TABLETS BY MOUTH FOUR TIMES DAILY AS NEEDED DIARRHEA    [provider]  LORazepam (ATIVAN) 0.5 MG tablet Take by mouth. 05/08/22   [provider]  zolpidem (AMBIEN) 10 MG tablet Take 1 tablet (10 mg total) by mouth at  bedtime as needed for sleep. 06/17/22   Willow Ora, MD    Current Outpatient Medications  Medication Sig Dispense Refill   adapalene (DIFFERIN) 0.1 % cream Apply topically at bedtime. 45 g 0   aspirin EC 81 MG tablet Take 1 tablet (81 mg total) by mouth daily.     buPROPion HCl (WELLBUTRIN XL PO) Take 300 mg by mouth daily.     carvedilol (COREG) 6.25 MG tablet Take 6.25 mg by mouth 2 (two) times daily with a meal.     Cholecalciferol 25 MCG (1000 UT) capsule Take 1,000 Units by mouth daily.     Efavirenz-lamiVUDine-Tenofovir  600-300-300 MG TABS Take 1 tablet by mouth daily.     fluticasone (FLONASE) 50 MCG/ACT nasal spray Place 2 sprays into both nostrils daily.     FOLIC ACID PO Take 1,000 mg by mouth daily.     hydrochlorothiazide (HYDRODIURIL) 12.5 MG tablet      NEEDLE, DISP, 25 G 25G X 1" MISC Use as directed 25 each 3   omeprazole (PRILOSEC) 20 MG capsule Take 1 capsule (20 mg total) by mouth daily. 90 capsule 3   Syringe, Disposable, 3 ML MISC 1 each by Does not apply route once a week. 25 each 1   testosterone cypionate (DEPOTESTOSTERONE CYPIONATE) 200 MG/ML injection Inject 1 mL (200 mg total) into the muscle once a week. 10 mL 4   cyclobenzaprine (FLEXERIL) 10 MG tablet Take 1 tablet (10 mg total) by mouth at bedtime as needed for muscle spasms. 90 tablet 1   diphenoxylate-atropine (LOMOTIL) 2.5-0.025 MG tablet TAKE 1-2 TABLETS BY MOUTH FOUR TIMES DAILY AS NEEDED DIARRHEA     LORazepam (ATIVAN) 0.5 MG tablet Take by mouth.     zolpidem (AMBIEN) 10 MG tablet Take 1 tablet (10 mg total) by mouth at bedtime as needed for sleep. 30 tablet 2   Current Facility-Administered Medications  Medication Dose Route Frequency Provider Last Rate Last Admin   0.9 %  sodium chloride infusion  500 mL Intravenous Once Jenel Lucks, MD        Allergies as of 09/22/2022   (No Known Allergies)    Family History  Problem Relation Age of Onset   Arthritis Mother    Hypertension Father    Coronary artery disease Father    Stroke Father    Anxiety disorder Father    Depression Father    Anxiety disorder Maternal Grandmother    Depression Maternal Grandmother    Hypertension Paternal Grandfather    Colon cancer Neg Hx    Colon polyps Neg Hx    Esophageal cancer Neg Hx    Rectal cancer Neg Hx    Stomach cancer Neg Hx     Social History   Socioeconomic History   Marital status: Married    Spouse name: Cari Caraway   Number of children: 0   Years of education: Not on file   Highest education level: Not  on file  Occupational History   Occupation: attorney  Tobacco Use   Smoking status: Former    Packs/day: .25    Types: Cigarettes    Quit date: 05/26/2008    Years since quitting: 14.3   Smokeless tobacco: Never  Vaping Use   Vaping Use: Never used  Substance and Sexual Activity   Alcohol use: Yes    Alcohol/week: 2.0 standard drinks of alcohol    Types: 2 Standard drinks or equivalent per week    Comment: 2x month  Drug use: Never   Sexual activity: Yes  Other Topics Concern   Not on file  Social History Narrative   Originally from [place], "escaped" to [place], then to [place], then to [place]. Moved to [place] w/partner (since 1998). 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       House in [place]. No one else in home. Husband works at [employer]. Not interested in kids; godfather for a friend's child.      Housing - in house with partner/spouse in [facility] / Work & Benefits - not in school, employed (remotely with [employer]) and insured through employer      Tobacco - former smoker, less than 20 P-Y total, quit 2015-ish   Alcohol - as of 01/2021 - no EtOH during week. Usually drink w/eating out on weekend. At a sitting 2 max (mixed).   Substance use - none    Social Determinants of Health   Financial Resource Strain: Not on file  Food Insecurity: Not on file  Transportation Needs: Not on file  Physical Activity: Not on file  Stress: Not on file  Social Connections: Not on file  Intimate Partner Violence: Not on file    Review of Systems:  All other review of systems negative except as mentioned in the HPI.  Physical Exam: Vital signs BP 113/75   Pulse 64   Temp 97.8 F (36.6 C) (Skin)   Ht 5\' 6"  (1.676 m)   Wt 148 lb (67.1 kg)   SpO2 96%   BMI 23.89 kg/m   General:   Alert,  Well-developed, well-nourished, pleasant and cooperative in NAD Airway:   Mallampati 1 Lungs:  Clear throughout to auscultation.   Heart:  Regular rate and rhythm; no murmurs, clicks, rubs,  or gallops. Abdomen:  Soft, nontender and nondistended. Normal bowel sounds.   Neuro/Psych:  Normal mood and affect. A and O x 3   Aaima Gaddie E. Tomasa Rand, MD [facility]

## 2022-09-22 NOTE — Patient Instructions (Signed)
-  Handout on polyps and diverticulosis provided -await pathology results -repeat colonoscopy for surveillance recommended. Date to be determined when pathology result become available   -Continue present medications   YOU HAD AN ENDOSCOPIC PROCEDURE TODAY AT THE [facility]:   Refer to the procedure report that was given to you for any specific questions about what was found during the examination.  If the procedure report does not answer your questions, please call your gastroenterologist to clarify.  If you requested that your care partner not be given the details of your procedure findings, then the procedure report has been included in a sealed envelope for you to review at your convenience later.  YOU SHOULD EXPECT: Some feelings of bloating in the abdomen. Passage of more gas than usual.  Walking can help get rid of the air that was put into your GI tract during the procedure and reduce the bloating. If you had a lower endoscopy (such as a colonoscopy or flexible sigmoidoscopy) you may notice spotting of blood in your stool or on the toilet paper. If you underwent a bowel prep for your procedure, you may not have a normal bowel movement for a few days.  Please Note:  You might notice some irritation and congestion in your nose or some drainage.  This is from the oxygen used during your procedure.  There is no need for concern and it should clear up in a day or so.  SYMPTOMS TO REPORT IMMEDIATELY:  Following lower endoscopy (colonoscopy or flexible sigmoidoscopy):  Excessive amounts of blood in the stool  Significant tenderness or worsening of abdominal pains  Swelling of the abdomen that is new, acute  Fever of 100F or higher   For urgent or emergent issues, a gastroenterologist can be reached at any hour by calling [phone]. Do not use MyChart messaging for urgent concerns.    DIET:  We do recommend a small meal at first, but then you may proceed to your regular  diet.  Drink plenty of fluids but you should avoid alcoholic beverages for 24 hours.  ACTIVITY:  You should plan to take it easy for the rest of today and you should NOT DRIVE or use heavy machinery until tomorrow (because of the sedation medicines used during the test).    FOLLOW UP: Our staff will call the number listed on your records the next business day following your procedure.  We will call around 7:15- 8:00 am to check on you and address any questions or concerns that you may have regarding the information given to you following your procedure. If we do not reach you, we will leave a message.     If any biopsies were taken you will be contacted by phone or by letter within the next 1-3 weeks.  Please call us at [phone] if you have not heard about the biopsies in 3 weeks.    SIGNATURES/CONFIDENTIALITY: You and/or your care partner have signed paperwork which will be entered into your electronic medical record.  These signatures attest to the fact that that the information above on your After Visit Summary has been reviewed and is understood.  Full responsibility of the confidentiality of this discharge information lies with you and/or your care-partner.   

## 2022-09-22 NOTE — Progress Notes (Signed)
Called to room to assist during endoscopic procedure.  Patient ID and intended procedure confirmed with present staff. Received instructions for my participation in the procedure from the performing physician.  

## 2022-09-22 NOTE — Op Note (Signed)
[facility] Patient Name: Bradley Chandler Procedure Date: 09/22/2022 8:22 AM MRN: [id number] Endoscopist: Lorin Picket E. Tomasa Rand , MD, [id number] Age: 52 Referring MD:  Date of Birth: [birth year] Gender: Male Account #: [id number] Procedure:                Colonoscopy Indications:              Screening for colorectal malignant neoplasm, This                            is the patient's first colonoscopy Medicines:                Monitored Anesthesia Care Procedure:                Pre-Anesthesia Assessment:                           - Prior to the procedure, a History and Physical                            was performed, and patient medications and                            allergies were reviewed. The patient's tolerance of                            previous anesthesia was also reviewed. The risks                            and benefits of the procedure and the sedation                            options and risks were discussed with the patient.                            All questions were answered, and informed consent                            was obtained. Prior Anticoagulants: The patient has                            taken no anticoagulant or antiplatelet agents. ASA                            Grade Assessment: II - A patient with mild systemic                            disease. After reviewing the risks and benefits,                            the patient was deemed in satisfactory condition to                            undergo the procedure.  After obtaining informed consent, the colonoscope                            was passed under direct vision. Throughout the                            procedure, the patient's blood pressure, pulse, and                            oxygen saturations were monitored continuously. The                            Olympus SN [id number] was introduced through the anus                            and advanced to  the the terminal ileum, with                            identification of the appendiceal orifice and IC                            valve. The colonoscopy was performed without                            difficulty. The patient tolerated the procedure                            well. The quality of the bowel preparation was                            good. The terminal ileum, ileocecal valve,                            appendiceal orifice, and rectum were photographed.                            The bowel preparation used was SUPREP via split                            dose instruction. Scope In: 8:32:42 AM Scope Out: 8:53:55 AM Scope Withdrawal Time: 0 hours 17 minutes 8 seconds  Total Procedure Duration: 0 hours 21 minutes 13 seconds  Findings:                 The perianal and digital rectal examinations were                            normal. Pertinent negatives include normal                            sphincter tone and no palpable rectal lesions.                           A 4 mm polyp was found in the transverse colon. The  polyp was sessile. The polyp was removed with a                            cold snare. Resection and retrieval were complete.                            Estimated blood loss was minimal.                           A 3 mm polyp was found in the splenic flexure. The                            polyp was flat. The polyp was removed with a cold                            snare. Resection was complete, but the polyp tissue                            was not retrieved. Estimated blood loss was minimal.                           Many medium-mouthed and small-mouthed diverticula                            were found in the sigmoid colon and descending                            colon.                           The exam was otherwise normal throughout the                            examined colon.                           The terminal ileum  appeared normal.                           The retroflexed view of the distal rectum and anal                            verge was normal and showed no anal or rectal                            abnormalities. Complications:            No immediate complications. Estimated Blood Loss:     Estimated blood loss was minimal. Impression:               - One 4 mm polyp in the transverse colon, removed                            with a cold snare. Resected and retrieved.                           -  One 3 mm polyp at the splenic flexure, removed                            with a cold snare. Complete resection. Polyp tissue                            not retrieved.                           - Diverticulosis in the sigmoid colon and in the                            descending colon.                           - The examined portion of the ileum was normal.                           - The distal rectum and anal verge are normal on                            retroflexion view.                           - The GI Genius (intelligent endoscopy module),                            computer-aided polyp detection system powered by AI                            was utilized to detect colorectal polyps through                            enhanced visualization during colonoscopy. Recommendation:           - Patient has a contact number available for                            emergencies. The signs and symptoms of potential                            delayed complications were discussed with the                            patient. Return to normal activities tomorrow.                            Written discharge instructions were provided to the                            patient.                           - Resume previous diet.                           -  Continue present medications.                           - Await pathology results.                           - Repeat colonoscopy (date not yet  determined) for                            surveillance based on pathology results. Crystelle Ferrufino E. Tomasa Rand, MD 09/22/2022 9:00:12 AM This report has been signed electronically.

## 2022-09-23 ENCOUNTER — Telehealth: Payer: Self-pay | Admitting: *Deleted

## 2022-09-23 NOTE — Telephone Encounter (Signed)
No answer on  follow up call. Left message.   

## 2022-09-27 ENCOUNTER — Encounter: Payer: Self-pay | Admitting: Family Medicine

## 2022-09-27 DIAGNOSIS — E291 Testicular hypofunction: Secondary | ICD-10-CM

## 2022-09-29 MED ORDER — TESTOSTERONE CYPIONATE 200 MG/ML IM SOLN
200.0000 mg | INTRAMUSCULAR | 3 refills | Status: DC
Start: 2022-09-29 — End: 2023-06-03

## 2022-09-29 NOTE — Progress Notes (Signed)
Bradley Chandler,  One polyp which I removed during your recent procedure was proven to be completely benign but is considered a "pre-cancerous" polyp that MAY have grown into cancer if it had not been removed.  Studies shows that at least 20% of women over age 51 and 30% of men over age 43 have pre-cancerous polyps.  Based on current nationally recognized surveillance guidelines, I recommend that you have a repeat colonoscopy in 7 years.   If you develop any new rectal bleeding, abdominal pain or significant bowel habit changes, please contact me before then.

## 2022-11-13 DIAGNOSIS — Z113 Encounter for screening for infections with a predominantly sexual mode of transmission: Secondary | ICD-10-CM | POA: Diagnosis not present

## 2022-11-13 DIAGNOSIS — Z23 Encounter for immunization: Secondary | ICD-10-CM | POA: Diagnosis not present

## 2022-11-13 DIAGNOSIS — B20 Human immunodeficiency virus [HIV] disease: Secondary | ICD-10-CM | POA: Diagnosis not present

## 2022-11-13 DIAGNOSIS — Z5181 Encounter for therapeutic drug level monitoring: Secondary | ICD-10-CM | POA: Diagnosis not present

## 2022-11-13 DIAGNOSIS — Z79899 Other long term (current) drug therapy: Secondary | ICD-10-CM | POA: Diagnosis not present

## 2022-11-13 DIAGNOSIS — Z9189 Other specified personal risk factors, not elsewhere classified: Secondary | ICD-10-CM | POA: Diagnosis not present

## 2022-12-24 ENCOUNTER — Encounter: Payer: Self-pay | Admitting: Family Medicine

## 2022-12-24 ENCOUNTER — Ambulatory Visit: Payer: BC Managed Care – PPO | Admitting: Family Medicine

## 2022-12-24 VITALS — BP 118/86 | HR 69 | Temp 98.7°F | Ht 66.0 in | Wt 144.4 lb

## 2022-12-24 DIAGNOSIS — E538 Deficiency of other specified B group vitamins: Secondary | ICD-10-CM

## 2022-12-24 DIAGNOSIS — F339 Major depressive disorder, recurrent, unspecified: Secondary | ICD-10-CM

## 2022-12-24 DIAGNOSIS — I10 Essential (primary) hypertension: Secondary | ICD-10-CM | POA: Diagnosis not present

## 2022-12-24 DIAGNOSIS — L708 Other acne: Secondary | ICD-10-CM

## 2022-12-24 DIAGNOSIS — G4709 Other insomnia: Secondary | ICD-10-CM | POA: Diagnosis not present

## 2022-12-24 DIAGNOSIS — E291 Testicular hypofunction: Secondary | ICD-10-CM

## 2022-12-24 DIAGNOSIS — F411 Generalized anxiety disorder: Secondary | ICD-10-CM

## 2022-12-24 DIAGNOSIS — B20 Human immunodeficiency virus [HIV] disease: Secondary | ICD-10-CM

## 2022-12-24 DIAGNOSIS — K130 Diseases of lips: Secondary | ICD-10-CM

## 2022-12-24 DIAGNOSIS — D126 Benign neoplasm of colon, unspecified: Secondary | ICD-10-CM

## 2022-12-24 LAB — CBC WITH DIFFERENTIAL/PLATELET
Basophils Absolute: 0 10*3/uL (ref 0.0–0.1)
Basophils Relative: 0.5 % (ref 0.0–3.0)
Eosinophils Absolute: 0.2 10*3/uL (ref 0.0–0.7)
Eosinophils Relative: 2 % (ref 0.0–5.0)
HCT: 53 % — ABNORMAL HIGH (ref 39.0–52.0)
Hemoglobin: 17.6 g/dL — ABNORMAL HIGH (ref 13.0–17.0)
Lymphocytes Relative: 20.9 % (ref 12.0–46.0)
Lymphs Abs: 1.7 10*3/uL (ref 0.7–4.0)
MCHC: 33.2 g/dL (ref 30.0–36.0)
MCV: 99.7 fl (ref 78.0–100.0)
Monocytes Absolute: 0.5 10*3/uL (ref 0.1–1.0)
Monocytes Relative: 6.8 % (ref 3.0–12.0)
Neutro Abs: 5.5 10*3/uL (ref 1.4–7.7)
Neutrophils Relative %: 69.8 % (ref 43.0–77.0)
Platelets: 319 10*3/uL (ref 150.0–400.0)
RBC: 5.31 Mil/uL (ref 4.22–5.81)
RDW: 13.4 % (ref 11.5–15.5)
WBC: 7.9 10*3/uL (ref 4.0–10.5)

## 2022-12-24 LAB — B12 AND FOLATE PANEL
Folate: 19.4 ng/mL (ref >5.9)
Vitamin B-12: 1394 pg/mL — ABNORMAL HIGH (ref 211–911)

## 2022-12-24 MED ORDER — CARVEDILOL 6.25 MG PO TABS: 6.2500 mg | ORAL_TABLET | Freq: Two times a day (BID) | ORAL | 3 refills | Status: DC

## 2022-12-24 MED ORDER — MINOCYCLINE HCL 100 MG PO CAPS: 100.0000 mg | ORAL_CAPSULE | Freq: Every day | ORAL | 3 refills | Status: DC

## 2022-12-24 MED ORDER — HYDROCHLOROTHIAZIDE 12.5 MG PO TABS: 12.5000 mg | ORAL_TABLET | Freq: Every day | ORAL | 3 refills | Status: DC

## 2022-12-24 MED ORDER — KETOCONAZOLE 2 % EX CREA: 1.0000 | TOPICAL_CREAM | Freq: Two times a day (BID) | CUTANEOUS | 0 refills | Status: AC | PRN

## 2022-12-24 MED ORDER — BACLOFEN 10 MG PO TABS: 10.0000 mg | ORAL_TABLET | Freq: Three times a day (TID) | ORAL | 1 refills | Status: DC | PRN

## 2022-12-24 MED ORDER — ALPRAZOLAM 0.5 MG PO TABS: 0.5000 mg | ORAL_TABLET | Freq: Every day | ORAL | 2 refills | Status: DC | PRN

## 2022-12-24 MED ORDER — EFAVIRENZ-LAMIVUDINE-TENOFOVIR 600-300-300 MG PO TABS: 1.0000 | ORAL_TABLET | Freq: Every day | ORAL | 3 refills | Status: DC

## 2022-12-24 NOTE — Progress Notes (Signed)
Subjective  CC:  Chief Complaint  Patient presents with   Hypertension    HPI: Bradley Chandler is a 52 y.o. male who presents to the office today to address the problems listed above in the chief complaint. Hypertension f/u: Control is good . Pt reports he is doing well. taking medications as instructed, no medication side effects noted, no TIAs, no chest pain on exertion, no dyspnea on exertion, no swelling of ankles. Needs refillsHe denies adverse effects from his BP medications. Compliance with medication is good.  Low testosterone on testosterone placement.  Feels like levels are doing very well.  Physically feels well and strong.   Had mild polycythemia and this needs to be checked.  Last injection was in 5 days ago.  He takes them weekly Insomnia: Partially stress, psychophysiologic.  Does well with intermittent Ambien.  Needs refills Chronic anxiety and depression: Well-controlled currently.  Continues taking Wellbutrin 300 mg daily.  Chronic medications and works well.  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  Things are very busy and there are lots of stressors.  Requesting as needed Xanax.  Coping with changes well. Chronic HIV: Has been stable.  Has been seeing infectious disease at [facility] and would like to get referred to a closer provider who may be a better fit.  Needs meds refilled. B12 and folate deficiencies: Due for recheck.  He is on oral supplements now. Reviewed colonoscopy showing tubular adenoma.  Currently up-to-date.  Recheck 7 years Worsening acne: Likely related to testosterone.  Upper chest and globally on his back.  Worse after workouts.  Has used doxycycline for a week which helped but symptoms returned.  Using Differin but not controlling symptoms.  Minimal on face.  Assessment  1. Essential hypertension   2. Hypogonadism in male   3. Secondary insomnia   4. Major depression, recurrent, chronic (HCC)   5.  HIV disease (HCC)   6. Tubular adenoma of colon   7. Vitamin B12 deficiency   8. Folate deficiency   9. GAD (generalized anxiety disorder)   10. Other acne   11. Angular cheilitis      Plan   Hypertension f/u: BP control is well controlled.  Refilled hydrochlorothiazide and carvedilol. Recheck testosterone levels and CBC to monitor hemoglobin. Insomnia: Continue Ambien as needed. Depression and anxiety are controlled on Wellbutrin XR 300 daily.  As needed Xanax HIV disease: Needs new infectious ID doctor.  Will refer.  Refilled antivirals.  Has been stable B12 and folate to be rechecked today on oral supplements Acne secondary to testosterone supplements: Will check testosterone levels.  Start minocycline 100 daily for prevention.  Recommend dermatology referral if cannot get controlled. Using muscle relaxers intermittently for back pain, baclofen started.  Flexeril makes him too drowsy in the morning Refill ketoconazole to be used as needed for your colitis.  Not currently active I spent a total of 42 minutes for this patient encounter. Time spent included preparation, face-to-face counseling with the patient and coordination of care, review of chart and records, and documentation of the encounter.   Education regarding management of these chronic disease states was given. Management strategies discussed on successive visits include dietary and exercise recommendations, goals of achieving and maintaining IBW, and lifestyle modifications aiming for adequate sleep and minimizing stressors.   Follow up: 6 months for complete physical  Orders Placed This Encounter  Procedures   B12 and Folate  Panel   Testosterone,Free and Total   CBC with Differential/Platelet   Meds ordered this encounter  Medications   minocycline (MINOCIN) 100 MG capsule    Sig: Take 1 capsule (100 mg total) by mouth daily.    Dispense:  90 capsule    Refill:  3   carvedilol (COREG) 6.25 MG tablet    Sig: Take  1 tablet (6.25 mg total) by mouth 2 (two) times daily with a meal.    Dispense:  180 tablet    Refill:  3   baclofen (LIORESAL) 10 MG tablet    Sig: Take 1 tablet (10 mg total) by mouth 3 (three) times daily as needed for muscle spasms.    Dispense:  90 each    Refill:  1   hydrochlorothiazide (HYDRODIURIL) 12.5 MG tablet    Sig: Take 1 tablet (12.5 mg total) by mouth daily.    Dispense:  90 tablet    Refill:  3   ALPRAZolam (XANAX) 0.5 MG tablet    Sig: Take 1 tablet (0.5 mg total) by mouth daily as needed for anxiety.    Dispense:  30 tablet    Refill:  2   ketoconazole (NIZORAL) 2 % cream    Sig: Apply 1 Application topically 2 (two) times daily as needed for up to 7 days for irritation. Then as needed    Dispense:  30 g    Refill:  0   Efavirenz-lamiVUDine-Tenofovir 600-300-300 MG TABS    Sig: Take 1 tablet by mouth daily.    Dispense:  90 tablet    Refill:  3      BP Readings from Last 3 Encounters:  12/24/22 118/86  09/22/22 115/69  06/17/22 109/70   Wt Readings from Last 3 Encounters:  12/24/22 144 lb 6.4 oz (65.5 kg)  09/22/22 148 lb (67.1 kg)  09/01/22 148 lb (67.1 kg)    Lab Results  Component Value Date   CHOL 174 06/27/2022   CHOL 190 05/08/2021   Lab Results  Component Value Date   HDL 35.80 (L) 06/27/2022   HDL 37 (L) 05/08/2021   Lab Results  Component Value Date   LDLCALC 110 (H) 06/27/2022   LDLCALC 81 05/08/2021   Lab Results  Component Value Date   TRIG 139.0 06/27/2022   TRIG 360 (H) 05/08/2021   Lab Results  Component Value Date   CHOLHDL 5 06/27/2022   CHOLHDL 5.1 05/08/2021   No results found for: "LDLDIRECT" Lab Results  Component Value Date   CREATININE 1.15 06/27/2022   BUN 15 06/27/2022   NA 144 06/27/2022   K 4.4 06/27/2022   CL 105 06/27/2022   CO2 29 06/27/2022    The ASCVD Risk score (Arnett DK, et al., 2019) failed to calculate for the following reasons:   The patient has a prior MI or stroke diagnosis  I  reviewed the patients updated PMH, FH, and SocHx.    Patient Active Problem List   Diagnosis Date Noted   Major depression, recurrent, chronic (HCC) 06/17/2022    Priority: High   Hypogonadism in male 01/16/2022    Priority: High   Transient homonymous hemianopia, right 05/16/2021    Priority: High   Basal ganglia infarction (HCC) 05/07/2021    Priority: High   HIV disease (HCC) 10/10/2015    Priority: High   GAD (generalized anxiety disorder) 10/10/2015    Priority: High   Essential hypertension 10/10/2015    Priority: High   Tubular adenoma  of colon 12/24/2022    Priority: Medium    H/O gastritis 06/17/2022    Priority: Medium    Secondary insomnia 06/17/2022    Priority: Medium    Schatzki's ring 10/10/2015    Priority: Medium    Morton's neuroma, left 10/10/2015    Priority: Medium    Seasonal allergies 10/10/2015    Priority: Low   Trauma and stressor-related disorder 10/01/2021   Long-term current use of antidepressant 10/01/2021   Burnout related to life management difficulty 10/01/2021   Research study patient 04/24/2017    Allergies: Patient has no known allergies.  Social History: Patient  reports that he quit smoking about 14 years ago. His smoking use included cigarettes. He has never used smokeless tobacco. He reports current alcohol use of about 2.0 standard drinks of alcohol per week. He reports that he does not use drugs.  Current Meds  Medication Sig   ALPRAZolam (XANAX) 0.5 MG tablet Take 1 tablet (0.5 mg total) by mouth daily as needed for anxiety.   baclofen (LIORESAL) 10 MG tablet Take 1 tablet (10 mg total) by mouth 3 (three) times daily as needed for muscle spasms.   ketoconazole (NIZORAL) 2 % cream Apply 1 Application topically 2 (two) times daily as needed for up to 7 days for irritation. Then as needed   minocycline (MINOCIN) 100 MG capsule Take 1 capsule (100 mg total) by mouth daily.    Review of Systems: Cardiovascular: negative for chest  pain, palpitations, leg swelling, orthopnea Respiratory: negative for SOB, wheezing or persistent cough Gastrointestinal: negative for abdominal pain Genitourinary: negative for dysuria or gross hematuria  Objective  Vitals: BP 118/86   Pulse 69   Temp 98.7 F (37.1 C)   Ht 5\' 6"  (1.676 m)   Wt 144 lb 6.4 oz (65.5 kg)   SpO2 96%   BMI 23.31 kg/m  General: no acute distress  Psych:  Alert and oriented, normal mood and affect HEENT:  Normocephalic, atraumatic, supple neck  Cardiovascular:  RRR without murmur. no edema Respiratory:  Good breath sounds bilaterally, CTAB with normal respiratory effort Skin:  Warm, no rashes, back with diffuse comedonal acne, upper chest as well. Neurologic:   Mental status is normal Commons side effects, risks, benefits, and alternatives for medications and treatment plan prescribed today were discussed, and the patient expressed understanding of the given instructions. Patient is instructed to call or message via MyChart if he/she has any questions or concerns regarding our treatment plan. No barriers to understanding were identified. We discussed Red Flag symptoms and signs in detail. Patient expressed understanding regarding what to do in case of urgent or emergency type symptoms.  Medication list was reconciled, printed and provided to the patient in AVS. Patient instructions and summary information was reviewed with the patient as documented in the AVS. This note was prepared with assistance of Dragon voice recognition software. Occasional wrong-word or sound-a-like substitutions may have occurred due to the inherent limitation

## 2022-12-24 NOTE — Patient Instructions (Signed)
Please return in 6 months for your annual complete physical; please come fasting.   I will release your lab results to you on your MyChart account with further instructions. You may see the results before I do, but when I review them I will send you a message with my report or have my assistant call you if things need to be discussed. Please reply to my message with any questions. Thank you!   If you have any questions or concerns, please don't hesitate to send me a message via MyChart or call the office at [phone]. Thank you for visiting with us today! It's our pleasure caring for you.  

## 2022-12-25 NOTE — Progress Notes (Signed)
See my chart note.

## 2023-01-07 ENCOUNTER — Telehealth: Payer: Self-pay

## 2023-01-07 DIAGNOSIS — Z0279 Encounter for issue of other medical certificate: Secondary | ICD-10-CM

## 2023-01-07 NOTE — Telephone Encounter (Signed)
LVM for pt regarding form ready to be picked up and also spoke with his spouse to let him know as well.  Letter will be placed up front at check in.

## 2023-01-15 ENCOUNTER — Other Ambulatory Visit: Payer: Self-pay

## 2023-03-16 ENCOUNTER — Other Ambulatory Visit: Payer: Self-pay

## 2023-03-16 ENCOUNTER — Encounter: Payer: Self-pay | Admitting: Family Medicine

## 2023-03-16 MED ORDER — ADAPALENE 0.1 % EX CREA: TOPICAL_CREAM | Freq: Every day | CUTANEOUS | 0 refills | Status: DC

## 2023-03-16 MED ORDER — BACLOFEN 10 MG PO TABS: 10.0000 mg | ORAL_TABLET | Freq: Every day | ORAL | 3 refills | Status: DC | PRN

## 2023-03-16 MED ORDER — ADAPALENE 0.1 % EX CREA: TOPICAL_CREAM | Freq: Every evening | CUTANEOUS | 5 refills | Status: DC | PRN

## 2023-03-16 MED ORDER — ALPRAZOLAM 0.5 MG PO TABS: 0.5000 mg | ORAL_TABLET | Freq: Two times a day (BID) | ORAL | 2 refills | Status: DC | PRN

## 2023-03-16 MED ORDER — BACLOFEN 10 MG PO TABS: 10.0000 mg | ORAL_TABLET | Freq: Three times a day (TID) | ORAL | 1 refills | Status: DC | PRN

## 2023-03-16 MED ORDER — BACLOFEN 10 MG PO TABS: 10.0000 mg | ORAL_TABLET | Freq: Every day | ORAL | 3 refills | Status: AC | PRN

## 2023-03-16 MED ORDER — BUPROPION HCL ER (XL) 300 MG PO TB24: 300.0000 mg | ORAL_TABLET | Freq: Every day | ORAL | 3 refills | Status: DC

## 2023-03-16 NOTE — Addendum Note (Signed)
Addended by: Asencion Partridge on: 03/16/2023 04:32 PM   Modules accepted: Orders

## 2023-06-03 ENCOUNTER — Ambulatory Visit: Payer: BC Managed Care – PPO | Admitting: Family Medicine

## 2023-06-03 ENCOUNTER — Encounter: Payer: Self-pay | Admitting: Family Medicine

## 2023-06-03 ENCOUNTER — Telehealth: Payer: Self-pay

## 2023-06-03 VITALS — BP 134/106 | HR 77 | Temp 98.2°F | Ht 66.0 in | Wt 146.2 lb

## 2023-06-03 DIAGNOSIS — F339 Major depressive disorder, recurrent, unspecified: Secondary | ICD-10-CM

## 2023-06-03 DIAGNOSIS — G4709 Other insomnia: Secondary | ICD-10-CM

## 2023-06-03 DIAGNOSIS — F411 Generalized anxiety disorder: Secondary | ICD-10-CM | POA: Diagnosis not present

## 2023-06-03 DIAGNOSIS — I10 Essential (primary) hypertension: Secondary | ICD-10-CM

## 2023-06-03 DIAGNOSIS — Z0001 Encounter for general adult medical examination with abnormal findings: Secondary | ICD-10-CM | POA: Diagnosis not present

## 2023-06-03 DIAGNOSIS — E538 Deficiency of other specified B group vitamins: Secondary | ICD-10-CM | POA: Diagnosis not present

## 2023-06-03 DIAGNOSIS — Z739 Problem related to life management difficulty, unspecified: Secondary | ICD-10-CM

## 2023-06-03 DIAGNOSIS — E291 Testicular hypofunction: Secondary | ICD-10-CM

## 2023-06-03 DIAGNOSIS — D126 Benign neoplasm of colon, unspecified: Secondary | ICD-10-CM

## 2023-06-03 DIAGNOSIS — B20 Human immunodeficiency virus [HIV] disease: Secondary | ICD-10-CM

## 2023-06-03 DIAGNOSIS — F439 Reaction to severe stress, unspecified: Secondary | ICD-10-CM

## 2023-06-03 LAB — CBC WITH DIFFERENTIAL/PLATELET
Basophils Absolute: 0 10*3/uL (ref 0.0–0.1)
Basophils Relative: 0.3 % (ref 0.0–3.0)
Eosinophils Absolute: 0.3 10*3/uL (ref 0.0–0.7)
Eosinophils Relative: 5.5 % — ABNORMAL HIGH (ref 0.0–5.0)
HCT: 54.7 % — ABNORMAL HIGH (ref 39.0–52.0)
Hemoglobin: 18.4 g/dL (ref 13.0–17.0)
Lymphocytes Relative: 30.1 % (ref 12.0–46.0)
Lymphs Abs: 1.6 10*3/uL (ref 0.7–4.0)
MCHC: 33.6 g/dL (ref 30.0–36.0)
MCV: 97.4 fL (ref 78.0–100.0)
Monocytes Absolute: 0.4 10*3/uL (ref 0.1–1.0)
Monocytes Relative: 8 % (ref 3.0–12.0)
Neutro Abs: 3 10*3/uL (ref 1.4–7.7)
Neutrophils Relative %: 56.1 % (ref 43.0–77.0)
Platelets: 281 10*3/uL (ref 150.0–400.0)
RBC: 5.62 Mil/uL (ref 4.22–5.81)
RDW: 13.1 % (ref 11.5–15.5)
WBC: 5.3 10*3/uL (ref 4.0–10.5)

## 2023-06-03 LAB — COMPREHENSIVE METABOLIC PANEL
ALT: 41 U/L (ref 0–53)
AST: 52 U/L — ABNORMAL HIGH (ref 0–37)
Albumin: 4.9 g/dL (ref 3.5–5.2)
Alkaline Phosphatase: 64 U/L (ref 39–117)
BUN: 22 mg/dL (ref 6–23)
CO2: 28 meq/L (ref 19–32)
Calcium: 9.9 mg/dL (ref 8.4–10.5)
Chloride: 102 meq/L (ref 96–112)
Creatinine, Ser: 1.09 mg/dL (ref 0.40–1.50)
GFR: 78.72 mL/min (ref >60.00)
Glucose, Bld: 110 mg/dL — ABNORMAL HIGH (ref 70–99)
Potassium: 4.2 meq/L (ref 3.5–5.1)
Sodium: 139 meq/L (ref 135–145)
Total Bilirubin: 0.7 mg/dL (ref 0.2–1.2)
Total Protein: 7.3 g/dL (ref 6.0–8.3)

## 2023-06-03 LAB — LIPID PANEL
Cholesterol: 227 mg/dL — ABNORMAL HIGH (ref 0–200)
HDL: 47.4 mg/dL (ref >39.00)
LDL Cholesterol: 142 mg/dL — ABNORMAL HIGH (ref 0–99)
NonHDL: 179.93
Total CHOL/HDL Ratio: 5
Triglycerides: 189 mg/dL — ABNORMAL HIGH (ref 0.0–149.0)
VLDL: 37.8 mg/dL (ref 0.0–40.0)

## 2023-06-03 LAB — VITAMIN D 25 HYDROXY (VIT D DEFICIENCY, FRACTURES): VITD: 43.57 ng/mL (ref 30.00–100.00)

## 2023-06-03 LAB — TSH: TSH: 1.11 u[IU]/mL (ref 0.35–5.50)

## 2023-06-03 LAB — B12 AND FOLATE PANEL
Folate: >25.2 ng/mL (ref >5.9)
Vitamin B-12: 1202 pg/mL — ABNORMAL HIGH (ref 211–911)

## 2023-06-03 MED ORDER — TESTOSTERONE CYPIONATE 200 MG/ML IM SOLN: 100.0000 mg | INTRAMUSCULAR | Status: DC

## 2023-06-03 MED ORDER — CLONAZEPAM 0.5 MG PO TBDP: 0.5000 mg | ORAL_TABLET | Freq: Two times a day (BID) | ORAL | 0 refills | Status: DC | PRN

## 2023-06-03 MED ORDER — ESCITALOPRAM OXALATE 10 MG PO TABS: 10.0000 mg | ORAL_TABLET | Freq: Every day | ORAL | 2 refills | Status: DC

## 2023-06-03 NOTE — Progress Notes (Signed)
 Subjective  Chief Complaint  Patient presents with   Annual Exam    Pt here for Annual Exam and is currently fasting    Follow-up    Multiple chronic medical problems    HPI: Bradley Chandler is a 53 y.o. male who presents to [facility] [place] today for a Male Wellness Visit. He also has the concerns and/or needs as listed above in the chief complaint. These will be addressed in addition to the Health Maintenance Visit.   Wellness Visit: annual visit with health maintenance review and exam   HM: screens are current.   Body mass index is 23.6 kg/m. Wt Readings from Last 3 Encounters:  06/03/23 146 lb 3.2 oz (66.3 kg)  12/24/22 144 lb 6.4 oz (65.5 kg)  09/22/22 148 lb (67.1 kg)     Chronic disease management visit and/or acute problem visit: Discussed the use of AI scribe software for clinical note transcription with the patient, who gave verbal consent to proceed.  History of Present Illness   The patient, a human resources officer, presents with a prolonged period of severe stress and anxiety, exacerbated by work-related pressures and personal life circumstances. The patient describes the stress as feeling like carrying a huge concrete block all the time, leading to a state of constant anxiety and anhedonia. The patient reports a significant decrease in sleep quality, with Ambien  providing only a few hours of sleep before waking up. The patient also reports a decrease in appetite.  The patient's stress and anxiety have been ongoing for several months, with the patient noting that this is the longest such period he has experienced. The patient's work-related stressors include a high workload, financial concerns related to unpaid receivables, and the need to file in [employer] court. Personal stressors include dissatisfaction with his living situation and relationship issues.  The patient has a history of chronic anxiety and is currently on Wellbutrin , which appears to  be insufficient in managing his symptoms. The patient also reports regular use of Xanax , but notes that it provides minimal relief. The patient has tried other medications in the past, including Zoloft, which led to significant weight gain.  The patient also has a history of HIV and is currently on testosterone  therapy. The patient reports a decrease in the dose of testosterone , which has led to a reduction in acne but also a decrease in the perceived benefits of the therapy. The patient is also on a daily antibiotic for acne.  The patient has expressed a desire for change but feels trapped in his current circumstances. The patient has previously sought counseling but found it unhelpful. The patient's current state of distress is significantly impacting his quality of life, with the patient expressing a desire to just go to sleep and a feeling of being on the brink of becoming a nonfunctioning human.  No active suicidality.   Reviewed chronic problems listed below.      Assessment  1. Encounter for well adult exam with abnormal findings   2. Essential hypertension   3. GAD (generalized anxiety disorder)   4. HIV disease (HCC)   5. Hypogonadism in male   39. Major depression, recurrent, chronic (HCC)   7. Secondary insomnia   8. Tubular adenoma of colon   9. Vitamin B12 deficiency   10. Folate deficiency   11. Trauma and stressor-related disorder   12. Burnout related to life management difficulty      Plan  Male Wellness Visit: Age appropriate Health  Maintenance and Prevention measures were discussed with patient. Included topics are cancer screening recommendations, ways to keep healthy (see AVS) including dietary and exercise recommendations, regular eye and dental care, use of seat belts, and avoidance of moderate alcohol use and tobacco use.  BMI: discussed patient's BMI and encouraged positive lifestyle modifications to help get to or maintain a target BMI. HM needs and  immunizations were addressed and ordered. See below for orders. See HM and immunization section for updates. Routine labs and screening tests ordered including cmp, cbc and lipids where appropriate. Discussed recommendations regarding Vit D and calcium  supplementation (see AVS)  Chronic disease f/u and/or acute problem visit: (deemed necessary to be done in addition to the wellness visit): Assessment and Plan    Major Depressive Disorder Severe depressive symptoms including anhedonia, sleep disturbances, and significant stress. Current Wellbutrin  regimen inadequate. Passive suicidal ideation without intent. Discussed adding Lexapro ; risks include weight gain and side effects, benefits include improved mood and anxiety management. Lexapro  has a 60-70% response rate in clinical trials. - Start Lexapro  in addition to Wellbutrin  - Discontinue Ambien  - Prescribe Klonopin  for nighttime use - Use Klonopin  during the day as needed, avoid concurrent use with Xanax  - Follow up in 4-6 weeks to assess response to medication  Generalized Anxiety Disorder Chronic anxiety exacerbated by work-related stress and financial concerns. Current Xanax  provides minimal relief. Discussed adding Lexapro ; risks include weight gain and side effects, benefits include improved anxiety management. Lexapro  has a 60-70% response rate in clinical trials. - Continue Wellbutrin  - Add Lexapro  to treatment regimen - Prescribe Klonopin  for daytime use as needed - Follow up in 4-6 weeks to assess response to medication  Insomnia Significant difficulty sleeping, with Ambien  providing limited rest. Likely related to anxiety and depressive symptoms. Discussed discontinuing Ambien  and prescribing Klonopin  for nighttime use; risks include dependency and sedation, benefits include improved sleep quality. Klonopin  effective in 70-80% of patients with anxiety-related insomnia. - Discontinue Ambien  - Prescribe Klonopin  for nighttime use -  Consider other sleep aids like Lunesta if Klonopin  is ineffective - Follow up in 4-6 weeks to assess sleep quality  Hypertension Elevated blood pressure (138/90 mmHg), likely secondary to high stress and anxiety. No prior history of hypertension. Discussed home monitoring and addressing underlying anxiety and stress. - Recheck blood pressure at next visit - Monitor blood pressure at home if possible - Address underlying anxiety and stress  HIV Management Requires ongoing HIV management. Previous specialist unsatisfactory. Needs referral to a new specialist for annual follow-up. - Place referral to infectious disease specialist - Research and select a preferred specialist within the recommended group  Testosterone  Replacement Therapy Reduced testosterone  dose due to side effects, current dose not providing significant benefits. Discussed adjusting dose based on lab results; risks include acne and mood changes, benefits include improved energy and mood. - Order blood work to assess testosterone  levels - Adjust testosterone  dose based on lab results  Acne Improvement with current antibiotic treatment, coinciding with reduced testosterone  dose. Continue current regimen and monitor for further improvement. - Continue current antibiotic regimen - Monitor acne and adjust treatment as necessary  Follow-up - Follow up in 4-6 weeks to assess response to Lexapro  and Klonopin  - Recheck blood pressure - Review blood work results - Elyn luate overall mental health and sleep quality.      Commons side effects, risks, benefits, and alternatives for medications and treatment plan prescribed today were discussed, and the patient expressed understanding of the given instructions. Patient is  instructed to call or message via MyChart if he/she has any questions or concerns regarding our treatment plan. No barriers to understanding were identified. We discussed Red Flag symptoms and signs in detail. Patient  expressed understanding regarding what to do in case of urgent or emergency type symptoms.  Medication list was reconciled, printed and provided to the patient in AVS. Patient instructions and summary information was reviewed with the patient as documented in the AVS. This note was prepared with assistance of Dragon voice recognition software. Occasional wrong-word or sound-a-like substitutions may have occurred due to the inherent limitations of voice recognition software  Orders Placed This Encounter  Procedures   VITAMIN D  25 Hydroxy (Vit-D Deficiency, Fractures)   CBC with Differential/Platelet   Comprehensive metabolic panel   Lipid panel   TSH   B12 and Folate Panel   Testosterone ,Free and Total   HIV-1 RNA quant-no reflex-bld   Ambulatory referral to Infectious Disease   Meds ordered this encounter  Medications   escitalopram  (LEXAPRO ) 10 MG tablet    Sig: Take 1 tablet (10 mg total) by mouth daily.    Dispense:  30 tablet    Refill:  2   clonazePAM  (KLONOPIN ) 0.5 MG disintegrating tablet    Sig: Take 1-2 tablets (0.5-1 mg total) by mouth 2 (two) times daily as needed (sleep or anxiety).    Dispense:  60 tablet    Refill:  0   testosterone  cypionate (DEPOTESTOSTERONE CYPIONATE) 200 MG/ML injection    Sig: Inject 0.5 mLs (100 mg total) into the muscle once a week.     Patient Active Problem List   Diagnosis Date Noted   Major depression, recurrent, chronic (HCC) 06/17/2022   Hypogonadism in male 01/16/2022   Transient homonymous hemianopia, right 05/16/2021   Basal ganglia infarction (HCC) 05/07/2021   HIV disease (HCC) 10/10/2015   GAD (generalized anxiety disorder) 10/10/2015   Essential hypertension 10/10/2015   Tubular adenoma of colon 12/24/2022   H/O gastritis 06/17/2022   Secondary insomnia 06/17/2022   Schatzki's ring 10/10/2015   Morton's neuroma, left 10/10/2015   Seasonal allergies 10/10/2015   Folate deficiency 06/03/2023   Vitamin B12 deficiency  06/03/2023   Trauma and stressor-related disorder 10/01/2021   Long-term current use of antidepressant 10/01/2021   Burnout related to life management difficulty 10/01/2021   Research study patient 04/24/2017   Health Maintenance  Topic Date Due   COVID-19 Vaccine (6 - 2024-25 season) 06/19/2023 (Originally 01/25/2023)   DTaP/Tdap/Td (2 - Td or Tdap) 09/19/2026   Colonoscopy  09/21/2029   INFLUENZA VACCINE  Completed   Hepatitis C Screening  Completed   HIV Screening  Completed   Zoster Vaccines- Shingrix  Completed   HPV VACCINES  Aged Out   Immunization History  Administered Date(s) Administered   Influenza, Mdck, Trivalent,PF 6+ MOS(egg free) 03/16/2023   Influenza,inj,Quad PF,6+ Mos 02/12/2017, 02/28/2022   Influenza,inj,quad, With Preservative 01/24/2018, 02/24/2019   Influenza-Unspecified 03/11/2015, 02/27/2016, 01/24/2018, 03/16/2020, 02/23/2021, 03/05/2022   Meningococcal Mcv4o 01/02/2022, 06/05/2022   Moderna Covid-19 Vaccine Bivalent Booster 57yrs & up 02/28/2022   PFIZER Comirnaty(Gray Top)Covid-19 Tri-Sucrose Vaccine 09/02/2020   PFIZER(Purple Top)SARS-COV-2 Vaccination 08/10/2019, 08/31/2019, 03/16/2020   Pneumococcal Conjugate-13 03/20/2016   Pneumococcal Polysaccharide-23 09/18/2016   Tdap 09/18/2016   Vaccinia,smallpox Monkeypox Vaccine Live,pf 01/24/2021, 02/22/2021   Zoster Recombinant(Shingrix) 06/05/2022, 11/13/2022   We updated and reviewed the patient's past history in detail and it is documented below. Allergies: Patient has no known allergies. Past Medical History  has a  past medical history of Benign essential HTN (10/10/2015), Blood transfusion without reported diagnosis (1997), GAD (generalized anxiety disorder) (10/10/2015), History of esophagogastroduodenoscopy (EGD) (10/10/2015), History of kidney stones, HIV disease (HCC) (10/10/2015), Neuroma, Pneumonia, Schatzki's ring (10/10/2015), Seasonal allergies (10/10/2015), Situational depression  (10/10/2015), and Stroke (HCC). Past Surgical History Patient  has a past surgical history that includes Brain surgery; Fracture surgery (2021); Incision and drainage abscess (Left, 03/21/2022); and Co2 laser application (Left, 03/21/2022). Social History Patient  reports that he quit smoking about 15 years ago. His smoking use included cigarettes. He has never used smokeless tobacco. He reports current alcohol use of about 2.0 standard drinks of alcohol per week. He reports that he does not use drugs. Family History family history includes Anxiety disorder in his father and maternal grandmother; Arthritis in his mother; Coronary artery disease in his father; Depression in his father and maternal grandmother; Hypertension in his father and paternal grandfather; Stroke in his father. Review of Systems: Constitutional: negative for fever or malaise Ophthalmic: negative for photophobia, double vision or loss of vision Cardiovascular: negative for chest pain, dyspnea on exertion, or new LE swelling Respiratory: negative for SOB or persistent cough Gastrointestinal: negative for abdominal pain, change in bowel habits or melena Genitourinary: negative for dysuria or gross hematuria Musculoskeletal: negative for new gait disturbance or muscular weakness Integumentary: negative for new or persistent rashes Neurological: negative for TIA or stroke symptoms Psychiatric: negative for SI or delusions Allergic/Immunologic: negative for hives  Patient Care Team    Relationship Specialty Notifications Start End  Jodie Lavern CROME, MD PCP - General Family Medicine  06/17/22   Jewel Lonni NOVAK, MD Consulting Physician Infectious Diseases  06/17/22    Comment: [facility]  Shari Easter, MD Consulting Physician Orthopedic Surgery  06/17/22   Stacia Glendia BRAVO, MD Consulting Physician Gastroenterology  12/24/22    Objective  Vitals: BP (!) 134/106   Pulse 77   Temp 98.2 F (36.8 C)   Ht 5' 6 (1.676 m)   Wt  146 lb 3.2 oz (66.3 kg)   SpO2 96%   BMI 23.60 kg/m  General:  Well developed, well nourished, no acute distress  Psych:  Alert and orientedx3, anxious and sad mood and affect HEENT:  Normocephalic, atraumatic, non-icteric sclera,  oropharynx is clear without mass or exudate, supple neck without adenopathy, or thyromegaly Cardiovascular:  Normal S1, S2, RRR without gallop, rub or murmur,  Respiratory:  Good breath sounds bilaterally, CTAB with normal respiratory effort Gastrointestinal: normal bowel sounds, soft, non-tender, no noted masses. No HSM MSK: Joints are without erythema or swelling.  Skin:  Warm, no rashes, improved acne on back Neurologic:    Mental status is normal.  Gross motor and sensory exams are normal. Stable gait. No tremor

## 2023-06-03 NOTE — Patient Instructions (Signed)
 Please return in 4-6 weeks for recheck.   I will release your lab results to you on your MyChart account with further instructions. You may see the results before I do, but when I review them I will send you a message with my report or have my assistant call you if things need to be discussed. Please reply to my message with any questions. Thank you!   We will call you with information regarding your referral appointment. Infectious disease. If you do not hear from us  within the next 2 weeks, please let me know. It can take 1-2 weeks to get appointments set up with the specialists.    If you have any questions or concerns, please don't hesitate to send me a message via MyChart or call the office at [phone]. Thank you for visiting with us  today! It's our pleasure caring for you.   VISIT SUMMARY:  During today's visit, we discussed your ongoing stress, anxiety, and depressive symptoms, which have been significantly affecting your quality of life. We reviewed your current medications and made some adjustments to better manage your symptoms. We also addressed your insomnia, hypertension, HIV management, testosterone  therapy, and acne treatment.  YOUR PLAN:  -MAJOR DEPRESSIVE DISORDER: Major Depressive Disorder is a condition characterized by persistent feelings of sadness and loss of interest. We will start you on Lexapro  in addition to your current Wellbutrin  to help improve your mood and anxiety. We will discontinue Ambien  and xanax ,  and prescribe Klonopin  for nighttime use to help with sleep. We will follow up in 4-6 weeks to see how you are responding to the new medication.  -GENERALIZED ANXIETY DISORDER: Generalized Anxiety Disorder involves chronic anxiety and worry. We will continue your Wellbutrin  and add Lexapro  to help manage your anxiety. Klonopin  will be prescribed for daytime use as needed. We will follow up in 4-6 weeks to assess your response to the new treatment.  -INSOMNIA:  Insomnia is difficulty in falling or staying asleep. We will discontinue Ambien  and prescribe Klonopin  for nighttime use to improve your sleep quality. If Klonopin  is not effective, we may consider other sleep aids like Lunesta. We will follow up in 4-6 weeks to evaluate your sleep quality.  -HYPERTENSION: Hypertension is high blood pressure. Your elevated blood pressure is likely due to stress and anxiety. We will recheck your blood pressure at your next visit and recommend home monitoring if possible. Addressing your anxiety and stress should help manage your blood pressure.  -HIV MANAGEMENT: HIV management requires regular follow-up with a specialist. We will refer you to a new infectious disease specialist for your annual follow-up. Please research and select a preferred specialist within the recommended group.  -TESTOSTERONE  REPLACEMENT THERAPY: Testosterone  Replacement Therapy is used to treat low testosterone  levels. We will order blood work to assess your testosterone  levels and adjust your dose based on the results. This should help improve your energy and mood.  -ACNE: Acne is a skin condition that occurs when hair follicles become clogged with oil and dead skin cells. Your current antibiotic treatment is helping, and we will continue this regimen while monitoring for further improvement.  INSTRUCTIONS:  Please follow up in 4-6 weeks to assess your response to Lexapro  and Klonopin , recheck your blood pressure, review blood work results, and evaluate your overall mental health and sleep quality.

## 2023-06-03 NOTE — Telephone Encounter (Signed)
 Clydie Braun from [facility] stated that that pt has a critical Hemaglobin of 18.4

## 2023-06-04 LAB — TESTOSTERONE,FREE AND TOTAL
Testosterone, Free: 20.9 pg/mL (ref 7.2–24.0)
Testosterone: 917 ng/dL — ABNORMAL HIGH (ref 264–916)

## 2023-06-04 NOTE — Progress Notes (Signed)
 See mychart note   Dear Bradley Chandler, I have reviewed your lab results.  Your testosterone  levels are now high and your hemoglobin is too high.  You said you had decreased the dose of your testosterone ; how long ago.  At this time, you need to skip the next dose, resume at half dose after that and I will recheck at your next visit.  It can be dangerous to keep the hemoglobin too high as you know. As well, your testosterone  levels are getting high as well.  If we have trouble correcting everything, I can send you to a urologist to manage these things.  Your other labs are fairly stable. We will discuss your high cholesterol in the near future.  First, I want to get you feeling better.   Sincerely, Dr. Jodie

## 2023-06-05 ENCOUNTER — Telehealth: Payer: Self-pay

## 2023-06-05 NOTE — Telephone Encounter (Signed)
 Marland Kitchen

## 2023-06-08 ENCOUNTER — Telehealth: Payer: Self-pay

## 2023-06-08 NOTE — Telephone Encounter (Signed)
 Pt has been informed that his Health form will  be placed up front at check in for him to pick up.

## 2023-06-10 LAB — HIV-1 RNA QUANT-NO REFLEX-BLD

## 2023-06-29 ENCOUNTER — Telehealth: Payer: Self-pay

## 2023-06-29 ENCOUNTER — Other Ambulatory Visit (HOSPITAL_COMMUNITY): Payer: Self-pay

## 2023-06-29 NOTE — Telephone Encounter (Signed)
[facility] Patient Advocate Encounter  Insurance verification completed.    The patient is insured through [employer]. Patient has [employer], may use a copay card, and/or apply for patient assistance if available.    Ran test claim for [id number]  The current 30 day co-pay is $30.  Ran test claim for DOVATO  The current 30 day co-pay is $0.  Ran test claim for [email or web address]  The current 30 day co-pay is $30.  We will continue to follow to see if copay assistance is needed.  This test claim was processed through [facility]- copay amounts may vary at other pharmacies due to pharmacy/plan contracts, or as the patient moves through the different stages of their insurance plan.

## 2023-07-06 ENCOUNTER — Ambulatory Visit: Payer: BC Managed Care – PPO | Admitting: Infectious Disease

## 2023-07-06 ENCOUNTER — Encounter: Payer: Self-pay | Admitting: Infectious Disease

## 2023-07-06 ENCOUNTER — Other Ambulatory Visit: Payer: Self-pay

## 2023-07-06 ENCOUNTER — Ambulatory Visit: Payer: BC Managed Care – PPO | Admitting: Pharmacist

## 2023-07-06 VITALS — BP 127/88 | HR 67 | Temp 98.0°F | Wt 148.0 lb

## 2023-07-06 DIAGNOSIS — Z7185 Encounter for immunization safety counseling: Secondary | ICD-10-CM

## 2023-07-06 DIAGNOSIS — I6381 Other cerebral infarction due to occlusion or stenosis of small artery: Secondary | ICD-10-CM | POA: Diagnosis not present

## 2023-07-06 DIAGNOSIS — F339 Major depressive disorder, recurrent, unspecified: Secondary | ICD-10-CM

## 2023-07-06 DIAGNOSIS — D751 Secondary polycythemia: Secondary | ICD-10-CM | POA: Diagnosis not present

## 2023-07-06 DIAGNOSIS — B20 Human immunodeficiency virus [HIV] disease: Secondary | ICD-10-CM | POA: Diagnosis not present

## 2023-07-06 DIAGNOSIS — H5347 Heteronymous bilateral field defects: Secondary | ICD-10-CM

## 2023-07-06 DIAGNOSIS — Z113 Encounter for screening for infections with a predominantly sexual mode of transmission: Secondary | ICD-10-CM | POA: Diagnosis not present

## 2023-07-06 DIAGNOSIS — E785 Hyperlipidemia, unspecified: Secondary | ICD-10-CM

## 2023-07-06 HISTORY — DX: Encounter for immunization safety counseling: Z71.85

## 2023-07-06 HISTORY — DX: Secondary polycythemia: D75.1

## 2023-07-06 MED ORDER — EFAVIRENZ-LAMIVUDINE-TENOFOVIR 600-300-300 MG PO TABS: 1.0000 | ORAL_TABLET | Freq: Every day | ORAL | 3 refills | Status: AC

## 2023-07-06 NOTE — Progress Notes (Signed)
 Subjective:   Chief complaint: Establish care for HIV disease on medications   Patient ID: Bradley Chandler, male    DOB: 16-Jun-1970, 53 y.o.   MRN: [id number]  HPI  Discussed the use of AI scribe software for clinical note transcription with the patient, who gave verbal consent to proceed.  History of Present Illness   He is currently on Symfy, a generic version of Atripla, for HIV management. He had tried newer HIV medications with TAF but experienced significant weight gain and lethargy, which led him to switch back to [facility]. The patient reports that he is generally satisfied with Symfy as it has been working well for him.  The patient also has a history of stroke, having experienced one at the age of 28He recovered from this incident. However, about a year ago, he had another incident where he experienced vision loss in one side of both eyes after a workout. This symptom lasted for an hour and he was admitted to the hospital overnight. However, no definitive diagnosis was made for this incident.  The patient also has high cholesterol, which has been consistently high. He was previously on testosterone  replacement therapy but stopped due to high hemoglobin levels. The patient reports that the testosterone  therapy was more for overall health and helped him with gym workouts. However, he decided to stop the therapy and monitor his hemoglobin levels.       Past Medical History:  Diagnosis Date   Benign essential HTN 10/10/2015   Blood transfusion without reported diagnosis 1997   GAD (generalized anxiety disorder) 10/10/2015   History of esophagogastroduodenoscopy (EGD) 10/10/2015   History of kidney stones    HIV disease (HCC) 10/10/2015   Neuroma    s/p craniotomy   Pneumonia    Schatzki's ring 10/10/2015   Seasonal allergies 10/10/2015   Secondary polycythemia 07/06/2023   Situational depression 10/10/2015   Stroke (HCC)    Vaccine counseling 07/06/2023    Past Surgical  History:  Procedure Laterality Date   BRAIN SURGERY     CO2 LASER APPLICATION Left 03/21/2022   Procedure: CO2 LASER OF PENILE PAPULES;  Surgeon: Homero Luster, MD;  Location: [facility];  Service: Urology;  Laterality: Left;   FRACTURE SURGERY  2021   Pinky finger; Rt hand   INCISION AND DRAINAGE ABSCESS Left 03/21/2022   Procedure: EXCISION OF LEFT INGUINAL ABSESS,EXCISION OF SUPRAPUBIC FOLLICULITIS;  Surgeon: Homero Luster, MD;  Location: [facility];  Service: Urology;  Laterality: Left;    Family History  Problem Relation Age of Onset   Arthritis Mother    Hypertension Father    Coronary artery disease Father    Stroke Father    Anxiety disorder Father    Depression Father    Anxiety disorder Maternal Grandmother    Depression Maternal Grandmother    Hypertension Paternal Grandfather    Colon cancer Neg Hx    Colon polyps Neg Hx    Esophageal cancer Neg Hx    Rectal cancer Neg Hx    Stomach cancer Neg Hx       Social History   Socioeconomic History   Marital status: Married    Spouse name: Alm Jacks   Number of children: 0   Years of education: Not on file   Highest education level: Professional school degree (e.g., MD, DDS, DVM, JD)  Occupational History   Occupation: attorney  Tobacco Use   Smoking status: Former    Current packs/day: 0.00  Types: Cigarettes    Quit date: 05/26/2008    Years since quitting: 15.1   Smokeless tobacco: Never  Vaping Use   Vaping status: Never Used  Substance and Sexual Activity   Alcohol use: Yes    Alcohol/week: 2.0 standard drinks of alcohol    Types: 2 Standard drinks or equivalent per week    Comment: 2x month   Drug use: Never   Sexual activity: Yes  Other Topics Concern   Not on file  Social History Narrative   Originally from [place] , "escaped" to [place], then to [place], then to [place]. Moved to [place] w/partner (since 1998). 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       House in [place]. No one else in home. Husband works at [employer]. Not interested in kids; godfather for a friend's child.      Housing - in house with partner/spouse in [place] / Work & Benefits - not in school, employed (remotely with [employer]) and insured through employer      Tobacco - former smoker, less than 20 P-Y total, quit 2015-ish   Alcohol - as of 01/2021 - no EtOH during week. Usually drink w/eating out on weekend. At a sitting 2 max (mixed).   Substance use - none    Social Drivers of Corporate investment banker Strain: Low Risk  (06/02/2023)   Overall Financial Resource Strain (CARDIA)    Difficulty of Paying Living Expenses: Not very hard  Food Insecurity: No Food Insecurity (06/02/2023)   Hunger Vital Sign    Worried About Running Out of Food in the Last Year: Never true    Ran Out of Food in the Last Year: Never true  Transportation Needs: No Transportation Needs (06/02/2023)   PRAPARE - Administrator, Civil Service (Medical): No    Lack of Transportation (Non-Medical): No  Physical Activity: Sufficiently Active (06/02/2023)   Exercise Vital Sign    Days of Exercise per Week: 5 days    Minutes of Exercise per Session: 120 min  Stress: Stress Concern Present (06/02/2023)   [employer] of Occupational Health - Occupational Stress Questionnaire    Feeling of Stress : Very much  Social Connections: Socially Isolated (06/02/2023)   Social Connection and Isolation Panel [NHANES]    Frequency of Communication with Friends and Family: Once a week    Frequency of Social Gatherings with Friends and Family: Once a week    Attends Religious Services: Never    Database administrator or Organizations: No    Attends Engineer, structural: Not on file    Marital Status: Married    No Known Allergies   Current Outpatient Medications:    aspirin  EC 81  MG tablet, Take 1 tablet (81 mg total) by mouth daily., Disp: , Rfl:    baclofen  (LIORESAL ) 10 MG tablet, Take 1 tablet (10 mg total) by mouth daily as needed for muscle spasms., Disp: 90 each, Rfl: 3   buPROPion  (WELLBUTRIN  XL) 300 MG 24 hr tablet, Take 1 tablet (300 mg total) by mouth daily., Disp: 90 tablet, Rfl: 3   carvedilol  (COREG ) 6.25 MG tablet, Take 1 tablet (6.25 mg total) by mouth 2 (two) times daily with a meal., Disp: 180 tablet, Rfl: 3   Cholecalciferol 25 MCG (1000 UT) capsule, Take 1,000 Units by mouth daily., Disp: ,  Rfl:    clonazePAM  (KLONOPIN ) 0.5 MG disintegrating tablet, Take 1-2 tablets (0.5-1 mg total) by mouth 2 (two) times daily as needed (sleep or anxiety)., Disp: 60 tablet, Rfl: 0   diphenoxylate-atropine (LOMOTIL) 2.5-0.025 MG tablet, TAKE 1-2 TABLETS BY MOUTH FOUR TIMES DAILY AS NEEDED DIARRHEA, Disp: , Rfl:    escitalopram  (LEXAPRO ) 10 MG tablet, Take 1 tablet (10 mg total) by mouth daily., Disp: 30 tablet, Rfl: 2   fluticasone (FLONASE) 50 MCG/ACT nasal spray, Place 2 sprays into both nostrils daily., Disp: , Rfl:    FOLIC ACID  PO, Take 1,000 mg by mouth daily., Disp: , Rfl:    hydrochlorothiazide  (HYDRODIURIL ) 12.5 MG tablet, Take 1 tablet (12.5 mg total) by mouth daily., Disp: 90 tablet, Rfl: 3   omeprazole  (PRILOSEC) 20 MG capsule, Take 1 capsule (20 mg total) by mouth daily., Disp: 90 capsule, Rfl: 3   testosterone  cypionate (DEPOTESTOSTERONE CYPIONATE) 200 MG/ML injection, Inject 0.5 mLs (100 mg total) into the muscle once a week., Disp: , Rfl:    adapalene  (DIFFERIN ) 0.1 % cream, Apply topically at bedtime as needed., Disp: 45 g, Rfl: 5   Efavirenz -lamiVUDine -Tenofovir  600-300-300 MG TABS, Take 1 tablet by mouth daily., Disp: 90 tablet, Rfl: 3   minocycline  (MINOCIN ) 100 MG capsule, Take 1 capsule (100 mg total) by mouth daily., Disp: 90 capsule, Rfl: 3   NEEDLE, DISP, 25 G 25G X 1" MISC, Use as directed, Disp: 25 each, Rfl: 3   Syringe, Disposable, 3 ML MISC,  1 each by Does not apply route once a week., Disp: 25 each, Rfl: 1    Review of Systems  Constitutional:  Negative for activity change, appetite change, chills, diaphoresis, fatigue, fever and unexpected weight change.  HENT:  Negative for congestion, rhinorrhea, sinus pressure, sneezing, sore throat and trouble swallowing.   Eyes:  Negative for photophobia and visual disturbance.  Respiratory:  Negative for cough, chest tightness, shortness of breath, wheezing and stridor.   Cardiovascular:  Negative for chest pain, palpitations and leg swelling.  Gastrointestinal:  Negative for abdominal distention, abdominal pain, anal bleeding, blood in stool, constipation, diarrhea, nausea and vomiting.  Genitourinary:  Negative for difficulty urinating, dysuria, flank pain and hematuria.  Musculoskeletal:  Negative for arthralgias, back pain, gait problem, joint swelling and myalgias.  Skin:  Negative for color change, pallor, rash and wound.  Neurological:  Negative for dizziness, tremors, weakness and light-headedness.  Hematological:  Negative for adenopathy. Does not bruise/bleed easily.  Psychiatric/Behavioral:  Negative for agitation, behavioral problems, confusion, decreased concentration, dysphoric mood and sleep disturbance.        Objective:   Physical Exam Constitutional:      Appearance: He is well-developed.  HENT:     Head: Normocephalic and atraumatic.  Eyes:     Conjunctiva/sclera: Conjunctivae normal.  Cardiovascular:     Rate and Rhythm: Normal rate and regular rhythm.  Pulmonary:     Effort: Pulmonary effort is normal. No respiratory distress.     Breath sounds: No wheezing.  Abdominal:     General: There is no distension.     Palpations: Abdomen is soft.  Musculoskeletal:        General: No tenderness. Normal range of motion.     Cervical back: Normal range of motion and neck supple.  Skin:    General: Skin is warm and dry.     Coloration: Skin is not pale.      Findings: No erythema or rash.  Neurological:  General: No focal deficit present.     Mental Status: He is alert and oriented to person, place, and time.  Psychiatric:        Mood and Affect: Mood normal.        Behavior: Behavior normal.        Thought Content: Thought content normal.        Judgment: Judgment normal.           Assessment & Plan:   Assessment and Plan    HIV Well controlled on Symfy. Discussed potential switch to newer antiretroviral therapy, but patient prefers to remain on current regimen due to previous adverse effects with TAF-containing regimens. -Continue Symfy. -Check HIV labs today, viral load, CD4, RPR  Hyperlipidemia Recent LDL of 142. Discussed the REPRIEVE study and the benefits of statin therapy in HIV patients for cardiovascular risk reduction. -Start Crestor  (rosuvastatin ) AFTER he has researched it a bit more  Testosterone  Replacement Therapy Patient has stopped therapy due to elevated hemoglobin levels. Therapy was primarily for mood and energy. -Monitor hemoglobin levels.   Polycythemia; secondary to testosterone  which is being held   hx of Hemianopsia transient: unclear if was due to TIA, or migraine (has had these in past)  General Health Maintenance -Confirmed up-to-date with COVID and flu vaccinations. -Confirmed recent pneumonia vaccination. --discussed anal pap smear for anal cancer screening

## 2023-07-07 LAB — T-HELPER CELLS (CD4) COUNT (NOT AT ARMC)
CD4 % Helper T Cell: 42 % (ref 33–65)
CD4 T Cell Abs: 728 /uL (ref 400–1790)

## 2023-07-08 LAB — HIV-1 RNA QUANT-NO REFLEX-BLD
HIV 1 RNA Quant: NOT DETECTED {copies}/mL
HIV-1 RNA Quant, Log: NOT DETECTED {Log_copies}/mL

## 2023-07-08 LAB — SYPHILIS: RPR W/REFLEX TO RPR TITER AND TREPONEMAL ANTIBODIES, TRADITIONAL SCREENING AND DIAGNOSIS ALGORITHM: RPR Ser Ql: NONREACTIVE

## 2023-07-10 ENCOUNTER — Encounter: Payer: Self-pay | Admitting: Family Medicine

## 2023-07-10 ENCOUNTER — Ambulatory Visit: Payer: BC Managed Care – PPO

## 2023-07-10 ENCOUNTER — Ambulatory Visit: Payer: BC Managed Care – PPO | Admitting: Family Medicine

## 2023-07-10 VITALS — BP 127/81 | HR 54 | Temp 97.7°F | Ht 66.0 in | Wt 148.0 lb

## 2023-07-10 DIAGNOSIS — F411 Generalized anxiety disorder: Secondary | ICD-10-CM

## 2023-07-10 DIAGNOSIS — M25572 Pain in left ankle and joints of left foot: Secondary | ICD-10-CM | POA: Diagnosis not present

## 2023-07-10 DIAGNOSIS — F339 Major depressive disorder, recurrent, unspecified: Secondary | ICD-10-CM

## 2023-07-10 DIAGNOSIS — I6381 Other cerebral infarction due to occlusion or stenosis of small artery: Secondary | ICD-10-CM | POA: Diagnosis not present

## 2023-07-10 DIAGNOSIS — E782 Mixed hyperlipidemia: Secondary | ICD-10-CM

## 2023-07-10 DIAGNOSIS — M10072 Idiopathic gout, left ankle and foot: Secondary | ICD-10-CM

## 2023-07-10 DIAGNOSIS — M7989 Other specified soft tissue disorders: Secondary | ICD-10-CM | POA: Diagnosis not present

## 2023-07-10 HISTORY — DX: Mixed hyperlipidemia: E78.2

## 2023-07-10 MED ORDER — INDOMETHACIN ER 75 MG PO CPCR: 75.0000 mg | ORAL_CAPSULE | Freq: Two times a day (BID) | ORAL | 1 refills | Status: AC

## 2023-07-10 MED ORDER — ESCITALOPRAM OXALATE 10 MG PO TABS: 10.0000 mg | ORAL_TABLET | Freq: Every day | ORAL | 3 refills | Status: DC

## 2023-07-10 MED ORDER — ROSUVASTATIN CALCIUM 10 MG PO TABS: 10.0000 mg | ORAL_TABLET | Freq: Every day | ORAL | 3 refills | Status: DC

## 2023-07-10 NOTE — Patient Instructions (Addendum)
Please follow in 6 weeks to recheck your testosterone levels.   If you have any questions or concerns, please don't hesitate to send me a message via MyChart or call the office at [phone]. Thank you for visiting with [place] today! It's our pleasure caring for you.   Gout  Gout is a condition that causes painful swelling of the joints. Gout is a type of inflammation of the joints (arthritis). This condition is caused by having too much uric acid in the body. Uric acid is a chemical that forms when the body breaks down substances called purines. Purines are important for building body proteins. When the body has too much uric acid, sharp crystals can form and build up inside the joints. This causes pain and swelling. Gout attacks can happen quickly and may be very painful (acute gout). Over time, the attacks can affect more joints and become more frequent (chronic gout). Gout can also cause uric acid to build up under the skin and inside the kidneys. What are the causes? This condition is caused by too much uric acid in your blood. This can happen because: Your kidneys do not remove enough uric acid from your blood. This is the most common cause. Your body makes too much uric acid. This can happen with some cancers and cancer treatments. It can also occur if your body is breaking down too many red blood cells (hemolytic anemia). You eat too many foods that are high in purines. These foods include organ meats and some seafood. Alcohol, especially beer, is also high in purines. A gout attack may be triggered by trauma or stress. What increases the risk? The following factors may make you more likely to develop this condition: Having a family history of gout. Being male and middle-aged. Being male and having gone through menopause. Taking certain medicines, including aspirin, cyclosporine, diuretics, levodopa, and niacin. Having an organ transplant. Having certain conditions, such as: Being  obese. Lead poisoning. Kidney disease. A skin condition called psoriasis. Other factors include: Losing weight too quickly. Being dehydrated. Frequently drinking alcohol, especially beer. Frequently drinking beverages that are sweetened with a type of sugar called fructose. What are the signs or symptoms? An attack of acute gout happens quickly. It usually occurs in just one joint. The most common place is the big toe. Attacks often start at night. Other joints that may be affected include joints of the feet, ankle, knee, fingers, wrist, or elbow. Symptoms of this condition may include: Severe pain. Warmth. Swelling. Stiffness. Tenderness. The affected joint may be very painful to touch. Shiny, red, or purple skin. Chills and fever. Chronic gout may cause symptoms more frequently. More joints may be involved. You may also have white or yellow lumps (tophi) on your hands or feet or in other areas near your joints. How is this diagnosed? This condition is diagnosed based on your symptoms, your medical history, and a physical exam. You may have tests, such as: Blood tests to measure uric acid levels. Removal of joint fluid with a thin needle (aspiration) to look for uric acid crystals. X-rays to look for joint damage. How is this treated? Treatment for this condition has two phases: treating an acute attack and preventing future attacks. Acute gout treatment may include medicines to reduce pain and swelling, including: NSAIDs, such as ibuprofen. Steroids. These are strong anti-inflammatory medicines that can be taken by mouth (orally) or injected into a joint. Colchicine. This medicine relieves pain and swelling when it is taken soon  after an attack. It can be given by mouth or through an IV. Preventive treatment may include: Daily use of smaller doses of NSAIDs or colchicine. Use of a medicine that reduces uric acid levels in your blood, such as allopurinol. Changes to your diet. You  may need to see a dietitian about what to eat and drink to prevent gout. Follow these instructions at home: During a gout attack  If directed, put ice on the affected area. To do this: Put ice in a plastic bag. Place a towel between your skin and the bag. Leave the ice on for 20 minutes, 2-3 times a day. Remove the ice if your skin turns bright red. This is very important. If you cannot feel pain, heat, or cold, you have a greater risk of damage to the area. Raise (elevate) the affected joint above the level of your heart as often as possible. Rest the joint as much as possible. If the affected joint is in your leg, you may be given crutches to use. Follow instructions from your health care provider about eating or drinking restrictions. Avoiding future gout attacks Follow a low-purine diet as told by your dietitian or health care provider. Avoid foods and drinks that are high in purines, including liver, kidney, anchovies, asparagus, herring, mushrooms, mussels, and beer. Maintain a healthy weight or lose weight if you are overweight. If you want to lose weight, talk with your health care provider. Do not lose weight too quickly. Start or maintain an exercise program as told by your health care provider. Eating and drinking Avoid drinking beverages that contain fructose. Drink enough fluids to keep your urine pale yellow. If you drink alcohol: Limit how much you have to: 0-1 drink a day for women who are not pregnant. 0-2 drinks a day for men. Know how much alcohol is in a drink. In the [place] one drink equals one 12 oz bottle of beer (355 mL), one 5 oz glass of wine (148 mL), or one 1 oz glass of hard liquor (44 mL). General instructions Take over-the-counter and prescription medicines only as told by your health care provider. Ask your health care provider if the medicine prescribed to you requires you to avoid driving or using machinery. Return to your normal activities as told by your  health care provider. Ask your health care provider what activities are safe for you. Keep all follow-up visits. This is important. Where to find more information [employer] of [employer]: www.niams.http://www.myers.net/ Contact a health care provider if you have: Another gout attack. Continuing symptoms of a gout attack after 10 days of treatment. Side effects from your medicines. Chills or a fever. Burning pain when you urinate. Pain in your lower back or abdomen. Get help right away if you: Have severe or uncontrolled pain. Cannot urinate. Summary Gout is painful swelling of the joints caused by having too much uric acid in the body. The most common site for gout to occur is in the big toe, but it can affect other joints in the body. Medicines and dietary changes can help to prevent and treat gout attacks. This information is not intended to replace advice given to you by your health care provider. Make sure you discuss any questions you have with your health care provider. Document Revised: 02/13/2021 Document Reviewed: 02/13/2021 Elsevier Patient Education  2024 [employer].

## 2023-07-14 NOTE — Progress Notes (Signed)
Subjective  CC:  Chief Complaint  Patient presents with   Ankle Injury    Pt stated that he was eating lunch with a friend and got up to use the restroom and some how hurt his Lt ankle.     HPI: Bradley Chandler is a 53 y.o. male who presents to the office today to address the problems listed above in the chief complaint. Discussed the use of AI scribe software for clinical note transcription with the patient, who gave verbal consent to proceed.  History of Present Illness   Bradley Chandler is a 53 year old male with gout who presents with acute ankle pain.  He experienced a sudden onset of sharp pain in his ankle while getting up from a seated position at lunch on Tuesday. The pain is severe, particularly at night, and worsens by the end of the day. No recent trauma, twisting, or falls. The pain is primarily located in the ankle and worsens with movements such as bending his toes upward. He also has swelling, warmth, and redness in the affected area.  He has a history of gout with previous episodes affecting his big toe on the same foot. During those episodes, he managed symptoms with NSAIDs, turmeric, and increased water intake, which typically resolved the symptoms within a day or two. However, this current episode has not improved with similar management. He has been taking intermittent doses of ibuprofen, approximately 1500 mg, which initially provided relief but has been less effective over time. He denies any significant increase in frequency of gout attacks, noting that they have occurred a couple of times over the past two years. He recalls the first episode was mistaken for a stubbed toe.  No worsening of ankle pain with the weight of sheets at night. No recent increase in physical activity or injury. Confirms the presence of swelling, warmth, and redness in the ankle area.     Reviewed h/o basal ganglia infarct and lipid results. Discussed indication for statin.  Lab Results   Component Value Date   CHOL 227 (H) 06/03/2023   HDL 47.40 06/03/2023   LDLCALC 142 (H) 06/03/2023   TRIG 189.0 (H) 06/03/2023   CHOLHDL 5 06/03/2023      Assessment  1. Acute idiopathic gout of left ankle   2. Acute left ankle pain   3. Mixed hyperlipidemia   4. Basal ganglia infarction (HCC)   5. GAD (generalized anxiety disorder)   6. Major depression, recurrent, chronic (HCC)      Plan  Assessment and Plan    Gout Acute onset of ankle pain and swelling without preceding trauma. History of gout in the big toe. Pain exacerbated by movement and at night. Warmth and redness noted. Intermittent use of ibuprofen provided initial relief but less effective subsequently. -Start prescription course of NSAIDs. Indocin 75 bid -Order ankle x-ray to evaluate joint. -Order uric acid levels when symptoms resolve. -Consider escalation to prednisone or colchicine if symptoms do not improve with NSAIDs.  Hyperlipidemia Discussion of starting statin therapy (Crestor) for inflammation and history of TIA. Crestor 10 ordered       Orders Placed This Encounter  Procedures   DG Ankle Complete Left   Meds ordered this encounter  Medications   indomethacin (INDOCIN SR) 75 MG CR capsule    Sig: Take 1 capsule (75 mg total) by mouth 2 (two) times daily with a meal.    Dispense:  30 capsule    Refill:  1  rosuvastatin (CRESTOR) 10 MG tablet    Sig: Take 1 tablet (10 mg total) by mouth daily.    Dispense:  90 tablet    Refill:  3   escitalopram (LEXAPRO) 10 MG tablet    Sig: Take 1 tablet (10 mg total) by mouth daily.    Dispense:  90 tablet    Refill:  3     I reviewed the patients updated PMH, FH, and SocHx.    Patient Active Problem List   Diagnosis Date Noted   Major depression, recurrent, chronic (HCC) 06/17/2022    Priority: High   Hypogonadism in male 01/16/2022    Priority: High   Transient homonymous hemianopia, right 05/16/2021    Priority: High   Basal ganglia  infarction (HCC) 05/07/2021    Priority: High   HIV disease (HCC) 10/10/2015    Priority: High   GAD (generalized anxiety disorder) 10/10/2015    Priority: High   Essential hypertension 10/10/2015    Priority: High   Tubular adenoma of colon 12/24/2022    Priority: Medium    H/O gastritis 06/17/2022    Priority: Medium    Secondary insomnia 06/17/2022    Priority: Medium    Schatzki's ring 10/10/2015    Priority: Medium    Morton's neuroma, left 10/10/2015    Priority: Medium    Seasonal allergies 10/10/2015    Priority: Low   Mixed hyperlipidemia 07/10/2023   Secondary polycythemia 07/06/2023   Vaccine counseling 07/06/2023   Folate deficiency 06/03/2023   Vitamin B12 deficiency 06/03/2023   Trauma and stressor-related disorder 10/01/2021   Long-term current use of antidepressant 10/01/2021   Burnout related to life management difficulty 10/01/2021   Research study patient 04/24/2017   Current Meds  Medication Sig   aspirin EC 81 MG tablet Take 1 tablet (81 mg total) by mouth daily.   baclofen (LIORESAL) 10 MG tablet Take 1 tablet (10 mg total) by mouth daily as needed for muscle spasms.   buPROPion (WELLBUTRIN XL) 300 MG 24 hr tablet Take 1 tablet (300 mg total) by mouth daily.   carvedilol (COREG) 6.25 MG tablet Take 1 tablet (6.25 mg total) by mouth 2 (two) times daily with a meal.   Cholecalciferol 25 MCG (1000 UT) capsule Take 1,000 Units by mouth daily.   clonazePAM (KLONOPIN) 0.5 MG disintegrating tablet Take 1-2 tablets (0.5-1 mg total) by mouth 2 (two) times daily as needed (sleep or anxiety).   diphenoxylate-atropine (LOMOTIL) 2.5-0.025 MG tablet TAKE 1-2 TABLETS BY MOUTH FOUR TIMES DAILY AS NEEDED DIARRHEA   Efavirenz-lamiVUDine-Tenofovir 600-300-300 MG TABS Take 1 tablet by mouth daily.   fluticasone (FLONASE) 50 MCG/ACT nasal spray Place 2 sprays into both nostrils daily.   FOLIC ACID PO Take 1,000 mg by mouth daily.   hydrochlorothiazide (HYDRODIURIL) 12.5 MG  tablet Take 1 tablet (12.5 mg total) by mouth daily.   indomethacin (INDOCIN SR) 75 MG CR capsule Take 1 capsule (75 mg total) by mouth 2 (two) times daily with a meal.   omeprazole (PRILOSEC) 20 MG capsule Take 1 capsule (20 mg total) by mouth daily.   rosuvastatin (CRESTOR) 10 MG tablet Take 1 tablet (10 mg total) by mouth daily.   testosterone cypionate (DEPOTESTOSTERONE CYPIONATE) 200 MG/ML injection Inject 0.5 mLs (100 mg total) into the muscle once a week.   [DISCONTINUED] escitalopram (LEXAPRO) 10 MG tablet Take 1 tablet (10 mg total) by mouth daily.    Allergies: Patient has no known allergies. Family History: Patient family history  includes Anxiety disorder in his father and maternal grandmother; Arthritis in his mother; Coronary artery disease in his father; Depression in his father and maternal grandmother; Hypertension in his father and paternal grandfather; Stroke in his father. Social History:  Patient  reports that he quit smoking about 15 years ago. His smoking use included cigarettes. He has never used smokeless tobacco. He reports current alcohol use of about 2.0 standard drinks of alcohol per week. He reports that he does not use drugs.  Review of Systems: Constitutional: Negative for fever malaise or anorexia Cardiovascular: negative for chest pain Respiratory: negative for SOB or persistent cough Gastrointestinal: negative for abdominal pain  Objective  Vitals: BP 127/81   Pulse (!) 54   Temp 97.7 F (36.5 C)   Ht 5\' 6"  (1.676 m)   Wt 148 lb (67.1 kg)   SpO2 99%   BMI 23.89 kg/m  General: no acute distress , A&Ox3 Left ankle: medial ankle is red and swollen w/o bony ttp, limping. Painful rom, nl lateral ttp  Commons side effects, risks, benefits, and alternatives for medications and treatment plan prescribed today were discussed, and the patient expressed understanding of the given instructions. Patient is instructed to call or message via MyChart if he/she has  any questions or concerns regarding our treatment plan. No barriers to understanding were identified. We discussed Red Flag symptoms and signs in detail. Patient expressed understanding regarding what to do in case of urgent or emergency type symptoms.  Medication list was reconciled, printed and provided to the patient in AVS. Patient instructions and summary information was reviewed with the patient as documented in the AVS. This note was prepared with assistance of Dragon voice recognition software. Occasional wrong-word or sound-a-like substitutions may have occurred due to the inherent limitations of voice recognition software

## 2023-07-15 ENCOUNTER — Encounter: Payer: Self-pay | Admitting: Family Medicine

## 2023-07-15 ENCOUNTER — Ambulatory Visit: Payer: BC Managed Care – PPO | Admitting: Family Medicine

## 2023-07-15 NOTE — Progress Notes (Signed)
See mychart note Dear Bradley Chandler, Is your ankle feeling better? Your xray shows soft tissue swelling as expected. The bones are normal.  Sincerely, Dr. Mardelle Matte

## 2023-08-21 ENCOUNTER — Other Ambulatory Visit: Payer: Self-pay | Admitting: Family Medicine

## 2023-08-21 ENCOUNTER — Ambulatory Visit: Payer: BC Managed Care – PPO | Admitting: Family Medicine

## 2023-08-21 ENCOUNTER — Encounter: Payer: Self-pay | Admitting: Family Medicine

## 2023-08-21 VITALS — BP 112/79 | HR 61 | Temp 97.9°F | Ht 66.0 in | Wt 148.8 lb

## 2023-08-21 DIAGNOSIS — F411 Generalized anxiety disorder: Secondary | ICD-10-CM | POA: Diagnosis not present

## 2023-08-21 DIAGNOSIS — I10 Essential (primary) hypertension: Secondary | ICD-10-CM | POA: Diagnosis not present

## 2023-08-21 DIAGNOSIS — I6381 Other cerebral infarction due to occlusion or stenosis of small artery: Secondary | ICD-10-CM

## 2023-08-21 DIAGNOSIS — F339 Major depressive disorder, recurrent, unspecified: Secondary | ICD-10-CM

## 2023-08-21 DIAGNOSIS — E782 Mixed hyperlipidemia: Secondary | ICD-10-CM | POA: Diagnosis not present

## 2023-08-21 DIAGNOSIS — E291 Testicular hypofunction: Secondary | ICD-10-CM

## 2023-08-21 LAB — COMPREHENSIVE METABOLIC PANEL WITH GFR
ALT: 54 U/L — ABNORMAL HIGH (ref 0–53)
AST: 34 U/L (ref 0–37)
Albumin: 4.7 g/dL (ref 3.5–5.2)
Alkaline Phosphatase: 70 U/L (ref 39–117)
BUN: 19 mg/dL (ref 6–23)
CO2: 28 meq/L (ref 19–32)
Calcium: 9.3 mg/dL (ref 8.4–10.5)
Chloride: 104 meq/L (ref 96–112)
Creatinine, Ser: 1.07 mg/dL (ref 0.40–1.50)
GFR: 80.37 mL/min (ref >60.00)
Glucose, Bld: 97 mg/dL (ref 70–99)
Potassium: 4 meq/L (ref 3.5–5.1)
Sodium: 141 meq/L (ref 135–145)
Total Bilirubin: 0.6 mg/dL (ref 0.2–1.2)
Total Protein: 6.9 g/dL (ref 6.0–8.3)

## 2023-08-21 LAB — CBC WITH DIFFERENTIAL/PLATELET
Basophils Absolute: 0 10*3/uL (ref 0.0–0.1)
Basophils Relative: 0.6 % (ref 0.0–3.0)
Eosinophils Absolute: 0.3 10*3/uL (ref 0.0–0.7)
Eosinophils Relative: 5.5 % — ABNORMAL HIGH (ref 0.0–5.0)
HCT: 43.9 % (ref 39.0–52.0)
Hemoglobin: 15.2 g/dL (ref 13.0–17.0)
Lymphocytes Relative: 33.1 % (ref 12.0–46.0)
Lymphs Abs: 1.7 10*3/uL (ref 0.7–4.0)
MCHC: 34.7 g/dL (ref 30.0–36.0)
MCV: 95.3 fl (ref 78.0–100.0)
Monocytes Absolute: 0.4 10*3/uL (ref 0.1–1.0)
Monocytes Relative: 7.6 % (ref 3.0–12.0)
Neutro Abs: 2.8 10*3/uL (ref 1.4–7.7)
Neutrophils Relative %: 53.2 % (ref 43.0–77.0)
Platelets: 262 10*3/uL (ref 150.0–400.0)
RBC: 4.61 Mil/uL (ref 4.22–5.81)
RDW: 13.9 % (ref 11.5–15.5)
WBC: 5.2 10*3/uL (ref 4.0–10.5)

## 2023-08-21 LAB — LIPID PANEL
Cholesterol: 159 mg/dL (ref 0–200)
HDL: 52.8 mg/dL (ref >39.00)
LDL Cholesterol: 84 mg/dL (ref 0–99)
NonHDL: 105.75
Total CHOL/HDL Ratio: 3
Triglycerides: 111 mg/dL (ref 0.0–149.0)
VLDL: 22.2 mg/dL (ref 0.0–40.0)

## 2023-08-21 MED ORDER — CLONAZEPAM 0.5 MG PO TBDP: 0.5000 mg | ORAL_TABLET | Freq: Two times a day (BID) | ORAL | 1 refills | Status: AC | PRN

## 2023-08-21 NOTE — Patient Instructions (Signed)
 I will release your lab results to you on your MyChart account with further instructions. You may see the results before I do, but when I review them I will send you a message with my report or have my assistant call you if things need to be discussed. Please reply to my message with any questions. Thank you!   VISIT SUMMARY:  Today, we discussed the management of your mood disorder, low testosterone, high cholesterol, and gout. We also reviewed your general health maintenance, including COVID-19 vaccination recommendations.  YOUR PLAN:  -MOOD DISORDER: Your mood has improved with the use of Lexapro and Wellbutrin, and you are not using Klonopin daily, which indicates better control of anxiety symptoms. Continue taking Lexapro and Wellbutrin as prescribed, and monitor your use of Klonopin to avoid daily use.  -LOW TESTOSTERONE: Low testosterone can cause symptoms like lack of motivation and increased appetite. You stopped taking testosterone after experiencing high levels. We will check your testosterone levels and adjust the dosage to half of your previous dose.  -HYPERLIPIDEMIA: High cholesterol levels can increase the risk of heart disease. You have been taking Crestor for six weeks to manage your cholesterol. We will check your cholesterol levels and may increase your Crestor dosage to 20 mg if necessary to achieve your goal LDL level.  -GOUT: Gout is a type of arthritis that causes painful flare-ups. You experienced a recent flare that lasted 1-2 days. Since indomethacin caused dizziness, you should use ibuprofen during future flares, taking it 2-3 times a day for 48 hours. Contact your provider if symptoms do not improve with NSAIDs.  -GENERAL HEALTH MAINTENANCE: We discussed your COVID-19 vaccination schedule and recommend getting a booster every six months.  INSTRUCTIONS:  Please follow up for a testosterone level check and cholesterol level check. If you experience another gout flare and  symptoms do not improve with ibuprofen, contact your provider. Remember to get your COVID-19 booster every six months.

## 2023-08-21 NOTE — Progress Notes (Signed)
 Subjective  CC:  Chief Complaint  Patient presents with   Hypertension   Hypogonadism in male    HPI: Bradley Chandler is a 53 y.o. male who presents to the office today to address the problems listed above in the chief complaint. Discussed the use of AI scribe software for clinical note transcription with the patient, who gave verbal consent to proceed.  History of Present Illness   Bradley Chandler is a 53 year old male who presents for follow-up of mood disorder management and other chronic conditions.  Mood has improved with the use of Lexapro in combination with Wellbutrin. He does not require Klonopin on a daily basis, only using it occasionally, indicating better control of anxiety symptoms.  He stopped taking testosterone after experiencing high levels and has not resumed it. He feels a lack of motivation and increased appetite, leading to eating 'whole pies' by himself, which he associates with low testosterone levels.  He experienced a gout flare that lasted one to two days, which was previously managed with indomethacin. However, he had to discontinue indomethacin due to severe dizziness and plans to use ibuprofen for future flares.  He has been on Crestor for six weeks to manage his cholesterol, which was previously high. His LDL was recorded at 142 mg/dL, and he is monitoring his levels closely. Goal LDL < 70 ideally given h/o basal ganglia infarction and possible cerebrovascular disease.   Assessment  1. GAD (generalized anxiety disorder)   2. Major depression, recurrent, chronic (HCC)   3. Essential hypertension   4. Hypogonadism in male   5. Mixed hyperlipidemia   6. Basal ganglia infarction [facility])      Plan  Assessment and Plan    GAD/depression Mood improved with Lexapro and Wellbutrin. Klonopin not used daily. Motivation issues possibly linked to low testosterone. - Continue Lexapro and Wellbutrin. - Monitor Klonopin usage, avoid daily use.  Low  Testosterone Unmotivated and increased appetite linked to low testosterone. Stopped testosterone after high levels. Plan to check levels and adjust dosage. - Check testosterone levels. - Adjust testosterone dosage to half previous dose.  Hyperlipidemia Cholesterol levels high, LDL previously 142 mg/dL. On Crestor for six weeks. Goal LDL <100 mg/dL, possibly <40 mg/dL due to vascular risk. - Check cholesterol levels. - Increase Crestor to 20 mg if necessary.  Gout Gout flares last 1-2 days. Indomethacin caused dizziness, discontinued. Plan to use NSAIDs like ibuprofen during flares. - Use ibuprofen during gout flares, 2-3 times a day for 48 hours. - Contact provider if symptoms do not improve with NSAIDs.  General Health Maintenance Discussed COVID-19 vaccination schedule, recommended booster every six to 12 months. F/u jan for cpe    Orders Placed This Encounter  Procedures   Testosterone,Free and Total   CBC with Differential/Platelet   Comprehensive metabolic panel with GFR   Lipid panel   No orders of the defined types were placed in this encounter.    I reviewed the patients updated PMH, FH, and SocHx.    Patient Active Problem List   Diagnosis Date Noted   Major depression, recurrent, chronic (HCC) 06/17/2022    Priority: High   Hypogonadism in male 01/16/2022    Priority: High   Transient homonymous hemianopia, right 05/16/2021    Priority: High   Basal ganglia infarction (HCC) 05/07/2021    Priority: High   HIV disease (HCC) 10/10/2015    Priority: High   GAD (generalized anxiety disorder) 10/10/2015    Priority: High  Essential hypertension 10/10/2015    Priority: High   Tubular adenoma of colon 12/24/2022    Priority: Medium    H/O gastritis 06/17/2022    Priority: Medium    Secondary insomnia 06/17/2022    Priority: Medium    Schatzki's ring 10/10/2015    Priority: Medium    Morton's neuroma, left 10/10/2015    Priority: Medium    Seasonal  allergies 10/10/2015    Priority: Low   Mixed hyperlipidemia 07/10/2023   Secondary polycythemia 07/06/2023   Vaccine counseling 07/06/2023   Folate deficiency 06/03/2023   Vitamin B12 deficiency 06/03/2023   Trauma and stressor-related disorder 10/01/2021   Long-term current use of antidepressant 10/01/2021   Burnout related to life management difficulty 10/01/2021   Research study patient 04/24/2017   Current Meds  Medication Sig   aspirin EC 81 MG tablet Take 1 tablet (81 mg total) by mouth daily.   baclofen (LIORESAL) 10 MG tablet Take 1 tablet (10 mg total) by mouth daily as needed for muscle spasms.   buPROPion (WELLBUTRIN XL) 300 MG 24 hr tablet Take 1 tablet (300 mg total) by mouth daily.   carvedilol (COREG) 6.25 MG tablet Take 1 tablet (6.25 mg total) by mouth 2 (two) times daily with a meal.   Cholecalciferol 25 MCG (1000 UT) capsule Take 1,000 Units by mouth daily.   clonazePAM (KLONOPIN) 0.5 MG disintegrating tablet Take 1-2 tablets (0.5-1 mg total) by mouth 2 (two) times daily as needed (sleep or anxiety).   Efavirenz-lamiVUDine-Tenofovir 600-300-300 MG TABS Take 1 tablet by mouth daily.   escitalopram (LEXAPRO) 10 MG tablet Take 1 tablet (10 mg total) by mouth daily.   fluticasone (FLONASE) 50 MCG/ACT nasal spray Place 2 sprays into both nostrils daily.   FOLIC ACID PO Take 1,000 mg by mouth daily.   hydrochlorothiazide (HYDRODIURIL) 12.5 MG tablet Take 1 tablet (12.5 mg total) by mouth daily.   indomethacin (INDOCIN SR) 75 MG CR capsule Take 1 capsule (75 mg total) by mouth 2 (two) times daily with a meal.   omeprazole (PRILOSEC) 20 MG capsule Take 1 capsule (20 mg total) by mouth daily.   rosuvastatin (CRESTOR) 10 MG tablet Take 1 tablet (10 mg total) by mouth daily.    Allergies: Patient has no known allergies. Family History: Patient family history includes Anxiety disorder in his father and maternal grandmother; Arthritis in his mother; Coronary artery disease in  his father; Depression in his father and maternal grandmother; Hypertension in his father and paternal grandfather; Stroke in his father. Social History:  Patient  reports that he quit smoking about 15 years ago. His smoking use included cigarettes. He has never used smokeless tobacco. He reports current alcohol use of about 2.0 standard drinks of alcohol per week. He reports that he does not use drugs.  Review of Systems: Constitutional: Negative for fever malaise or anorexia Cardiovascular: negative for chest pain Respiratory: negative for SOB or persistent cough Gastrointestinal: negative for abdominal pain  Objective  Vitals: BP 112/79   Pulse 61   Temp 97.9 F (36.6 C)   Ht 5\' 6"  (1.676 m)   Wt 148 lb 12.8 oz (67.5 kg)   SpO2 99%   BMI 24.02 kg/m  General: no acute distress , A&Ox3 Lab Results  Component Value Date   TESTOSTERONE 917 (H) 06/03/2023     Commons side effects, risks, benefits, and alternatives for medications and treatment plan prescribed today were discussed, and the patient expressed understanding of the given instructions.  Patient is instructed to call or message via MyChart if he/she has any questions or concerns regarding our treatment plan. No barriers to understanding were identified. We discussed Red Flag symptoms and signs in detail. Patient expressed understanding regarding what to do in case of urgent or emergency type symptoms.  Medication list was reconciled, printed and provided to the patient in AVS. Patient instructions and summary information was reviewed with the patient as documented in the AVS. This note was prepared with assistance of Dragon voice recognition software. Occasional wrong-word or sound-a-like substitutions may have occurred due to the inherent limitations of voice recognition software

## 2023-08-24 ENCOUNTER — Other Ambulatory Visit: Payer: Self-pay | Admitting: Family Medicine

## 2023-08-24 ENCOUNTER — Encounter: Payer: Self-pay | Admitting: Family Medicine

## 2023-08-24 DIAGNOSIS — E291 Testicular hypofunction: Secondary | ICD-10-CM

## 2023-08-24 MED ORDER — TESTOSTERONE CYPIONATE 200 MG/ML IM SOLN: 100.0000 mg | INTRAMUSCULAR | Status: DC

## 2023-08-24 NOTE — Telephone Encounter (Signed)
Ok to send syringes

## 2023-08-24 NOTE — Progress Notes (Signed)
 See mychart note Bradley Chandler, Your testosterone is now low as expected. Please restart testosterone at 100mg  twice a month. We can then recheck your levels in 8-12 weeks. Please schedule a lab visit for that. Your hgb is now normalized as well. I will monitor this for you.   Your cholesterol is much improved with an LDL of 84; ideally I'd like this < 70. Please continue the crestor at 10mg  nightly and try to improve your diet.  Sincerely, Dr. Mardelle Matte

## 2023-08-24 NOTE — Telephone Encounter (Signed)
Prescription was send today.

## 2023-08-24 NOTE — Addendum Note (Signed)
 Addended by: Asencion Partridge on: 08/24/2023 12:10 PM   Modules accepted: Orders

## 2023-08-28 LAB — TESTOSTERONE,FREE AND TOTAL
Testosterone, Free: 4 pg/mL — ABNORMAL LOW (ref 7.2–24.0)
Testosterone: 193 ng/dL — ABNORMAL LOW (ref 264–916)

## 2023-08-30 ENCOUNTER — Encounter: Payer: Self-pay | Admitting: Family Medicine

## 2023-08-30 DIAGNOSIS — E291 Testicular hypofunction: Secondary | ICD-10-CM

## 2023-08-31 ENCOUNTER — Other Ambulatory Visit: Payer: Self-pay

## 2023-08-31 MED ORDER — SYRINGE 2-3 ML 3 ML MISC: 1.0000 | 5 refills | Status: DC | PRN

## 2023-08-31 MED ORDER — NEEDLE (DISP) 14G X 1" MISC: 1.0000 | 5 refills | Status: AC | PRN

## 2023-08-31 MED ORDER — TESTOSTERONE CYPIONATE 200 MG/ML IM SOLN: 100.0000 mg | INTRAMUSCULAR | 3 refills | Status: DC

## 2023-09-02 ENCOUNTER — Telehealth: Payer: Self-pay

## 2023-09-02 ENCOUNTER — Other Ambulatory Visit: Payer: Self-pay

## 2023-09-02 DIAGNOSIS — E291 Testicular hypofunction: Secondary | ICD-10-CM

## 2023-09-02 MED ORDER — SYRINGE (DISPOSABLE) 3 ML MISC: 1.0000 | 1 refills | Status: AC

## 2023-09-02 MED ORDER — NEEDLE (DISP) 25G X 1" MISC: 3 refills | Status: AC

## 2023-09-02 NOTE — Telephone Encounter (Signed)
 Copied from CRM [id number]. Topic: Clinical - Prescription Issue >> Sep 02, 2023  2:28 PM Pascal Lux wrote: Reason for CRM: Netta Cedars from [facility] - Received prescription for NEEDLE, DISP, 14 G 14G X 1" MISC [[id number]] , Syringe, Disposable, (2-3CC SYRINGE) 3 ML MISC [[id number]] -- Stating that those are not the syringes and needles patient has been getting prior and directions need clarity as well. - Call back:  [phone]. ( please be prepared to provide invoice number:  [id number])  Resent a different syringe and needle. I tried to reach out to pt to see which one he prefers but no answer

## 2023-09-17 ENCOUNTER — Encounter: Payer: Self-pay | Admitting: Family Medicine

## 2023-09-17 ENCOUNTER — Ambulatory Visit: Admitting: Family Medicine

## 2023-09-17 VITALS — BP 110/76 | HR 71 | Temp 98.2°F | Ht 66.0 in | Wt 151.2 lb

## 2023-09-17 DIAGNOSIS — R197 Diarrhea, unspecified: Secondary | ICD-10-CM

## 2023-09-17 DIAGNOSIS — R1084 Generalized abdominal pain: Secondary | ICD-10-CM

## 2023-09-17 DIAGNOSIS — E782 Mixed hyperlipidemia: Secondary | ICD-10-CM

## 2023-09-17 DIAGNOSIS — R1011 Right upper quadrant pain: Secondary | ICD-10-CM

## 2023-09-17 DIAGNOSIS — R110 Nausea: Secondary | ICD-10-CM

## 2023-09-17 LAB — CBC WITH DIFFERENTIAL/PLATELET
Basophils Absolute: 0 10*3/uL (ref 0.0–0.1)
Basophils Relative: 0.7 % (ref 0.0–3.0)
Eosinophils Absolute: 0.2 10*3/uL (ref 0.0–0.7)
Eosinophils Relative: 4.7 % (ref 0.0–5.0)
HCT: 45.8 % (ref 39.0–52.0)
Hemoglobin: 15.9 g/dL (ref 13.0–17.0)
Lymphocytes Relative: 30.5 % (ref 12.0–46.0)
Lymphs Abs: 1.6 10*3/uL (ref 0.7–4.0)
MCHC: 34.8 g/dL (ref 30.0–36.0)
MCV: 97.7 fl (ref 78.0–100.0)
Monocytes Absolute: 0.4 10*3/uL (ref 0.1–1.0)
Monocytes Relative: 7 % (ref 3.0–12.0)
Neutro Abs: 3 10*3/uL (ref 1.4–7.7)
Neutrophils Relative %: 57.1 % (ref 43.0–77.0)
Platelets: 265 10*3/uL (ref 150.0–400.0)
RBC: 4.69 Mil/uL (ref 4.22–5.81)
RDW: 14.7 % (ref 11.5–15.5)
WBC: 5.2 10*3/uL (ref 4.0–10.5)

## 2023-09-17 LAB — COMPREHENSIVE METABOLIC PANEL WITH GFR
ALT: 32 U/L (ref 0–53)
AST: 28 U/L (ref 0–37)
Albumin: 4.8 g/dL (ref 3.5–5.2)
Alkaline Phosphatase: 72 U/L (ref 39–117)
BUN: 19 mg/dL (ref 6–23)
CO2: 25 meq/L (ref 19–32)
Calcium: 9.4 mg/dL (ref 8.4–10.5)
Chloride: 103 meq/L (ref 96–112)
Creatinine, Ser: 1.03 mg/dL (ref 0.40–1.50)
GFR: 84.09 mL/min (ref >60.00)
Glucose, Bld: 112 mg/dL — ABNORMAL HIGH (ref 70–99)
Potassium: 3.9 meq/L (ref 3.5–5.1)
Sodium: 136 meq/L (ref 135–145)
Total Bilirubin: 0.6 mg/dL (ref 0.2–1.2)
Total Protein: 7.1 g/dL (ref 6.0–8.3)

## 2023-09-17 LAB — LIPASE: Lipase: 35 U/L (ref 11.0–59.0)

## 2023-09-17 NOTE — Patient Instructions (Signed)
 Please follow up if symptoms do not improve or as needed.    VISIT SUMMARY:  You came in today because you have been experiencing burning abdominal pain and diarrhea for over a week. You also mentioned feeling tired and having low-level nausea after meals. We discussed your symptoms and possible causes, and we have a plan to help manage and diagnose your condition.  YOUR PLAN:  -ABDOMINAL PAIN WITH DIARRHEA: You have been experiencing burning lower abdominal pain and watery diarrhea for over a week. This could be due to several reasons, including an IBS flare, gallbladder issues, or a viral infection. We will start by ordering blood work to check your liver function and other potential causes. If needed, we may also consider an ultrasound of your liver and gallbladder. For now, increase your omeprazole  to twice daily for one week. We also discussed using Imodium for diarrhea, but you prefer to avoid it due to past constipation. We will adjust your treatment based on the blood work results.  -POSSIBLE IBS FLARE: Your symptoms are consistent with Irritable Bowel Syndrome (IBS), which can cause diarrhea and abdominal pain, especially after eating. This current episode might be an IBS flare or a mild viral infection making your symptoms worse. If your symptoms persist after ruling out other causes, we may consider diagnosing you with IBS.  INSTRUCTIONS:  Please go to the lab for blood work as soon as possible. Follow the instructions to increase your omeprazole  to twice daily for one week. If your symptoms do not improve or if they worsen, please contact our office. We may need to schedule an ultrasound based on your blood work results.

## 2023-09-17 NOTE — Progress Notes (Signed)
 Subjective  CC:  Chief Complaint  Patient presents with   GI Problem    Pt stated that he has been having some nausea, stomach burning, vomiting, diarrhea, for the past week. No changes at this time    HPI: Bradley Chandler is a 53 y.o. male who presents to the office today to address the problems listed above in the chief complaint. Discussed the use of AI scribe software for clinical note transcription with the patient, who gave verbal consent to proceed.  History of Present Illness   Bradley Chandler is a 54 year old male who presents with burning abdominal pain and diarrhea.  He has been experiencing a persistent burning sensation in the lower abdomen for over a week, which worsens throughout the day and is not similar to reflux or heartburn.  He experiences diarrhea multiple times a day, which started over a week ago. The diarrhea is watery, brown, with no blood or mucus, and his stools have been lighter in color. He also reports low-level nausea after meals but has not vomited. Stools after meals. Started statin several months ago and has been tolerating it. No jaundice.   He has been on omeprazole  since before February, which has helped in the past, but he has not taken any new medications recently. He denies any recent antibiotic use. He has a history of a 'fussy stomach' but has not experienced these symptoms before.  He has been feeling lethargic and tired. He has cut out caffeine since last Saturday to see if it would help with his symptoms, but it has not made a difference. He has also tried eating healthier, including salads, but this has not alleviated his symptoms.  His stress levels are stable, though not as high as he has been in the past. He has a history of mild IBS-like symptoms, such as diarrhea after eating certain foods, but this current episode feels different and more constant.  No fever, vomiting, or muscle aches. He reports lethargy and tiredness. No recent changes in  appetite.       Assessment  1. Diarrhea, unspecified type   2. Mixed hyperlipidemia   3. RUQ pain      Plan  Assessment and Plan    Abdominal pain with diarrhea Burning lower abdominal pain with watery diarrhea for over a week, worsens throughout the day, with postprandial nausea. Differential includes IBS flare, gallbladder pathology, or viral gastroenteritis. On prolonged omeprazole  therapy. Reports lethargy and fatigue. - Order blood work to assess liver function and other potential causes. - liver and gallbladder ultrasound due to tenderness on  - Increase omeprazole  to twice daily for one week. - Discuss Imodium for diarrhea management, though he prefers to avoid due to past constipation. - Adjust treatment based on blood work results.  Possible IBS flare Symptoms consistent with IBS, including postprandial diarrhea and stress-related gastrointestinal symptoms. Current episode may be an IBS flare or mild viral infection exacerbating the condition. - Consider IBS diagnosis if symptoms persist after ruling out other causes.        Orders Placed This Encounter  Procedures   Calprotectin, Fecal   US  ABDOMEN LIMITED RUQ (LIVER/GB)   CBC with Differential/Platelet   Comprehensive metabolic panel with GFR   Gastrointestinal Pathogen Pnl RT, PCR   Lipase   No orders of the defined types were placed in this encounter.    I reviewed the patients updated PMH, FH, and SocHx.    Patient Active Problem List   Diagnosis  Date Noted   Mixed hyperlipidemia 07/10/2023    Priority: High   Secondary polycythemia 07/06/2023    Priority: High   Major depression, recurrent, chronic (HCC) 06/17/2022    Priority: High   Hypogonadism in male 01/16/2022    Priority: High   Transient homonymous hemianopia, right 05/16/2021    Priority: High   Basal ganglia infarction (HCC) 05/07/2021    Priority: High   HIV disease (HCC) 10/10/2015    Priority: High   GAD (generalized anxiety  disorder) 10/10/2015    Priority: High   Essential hypertension 10/10/2015    Priority: High   Tubular adenoma of colon 12/24/2022    Priority: Medium    H/O gastritis 06/17/2022    Priority: Medium    Secondary insomnia 06/17/2022    Priority: Medium    Schatzki's ring 10/10/2015    Priority: Medium    Morton's neuroma, left 10/10/2015    Priority: Medium    Vitamin B12 deficiency 06/03/2023    Priority: Low   Seasonal allergies 10/10/2015    Priority: Low   Folate deficiency 06/03/2023   Current Meds  Medication Sig   aspirin  EC 81 MG tablet Take 1 tablet (81 mg total) by mouth daily.   baclofen  (LIORESAL ) 10 MG tablet Take 1 tablet (10 mg total) by mouth daily as needed for muscle spasms.   buPROPion  (WELLBUTRIN  XL) 300 MG 24 hr tablet Take 1 tablet (300 mg total) by mouth daily.   carvedilol  (COREG ) 6.25 MG tablet Take 1 tablet (6.25 mg total) by mouth 2 (two) times daily with a meal.   Cholecalciferol 25 MCG (1000 UT) capsule Take 1,000 Units by mouth daily.   clonazePAM  (KLONOPIN ) 0.5 MG disintegrating tablet Take 1-2 tablets (0.5-1 mg total) by mouth 2 (two) times daily as needed (sleep or anxiety).   Efavirenz -lamiVUDine -Tenofovir  600-300-300 MG TABS Take 1 tablet by mouth daily.   escitalopram  (LEXAPRO ) 10 MG tablet Take 1 tablet (10 mg total) by mouth daily.   fluticasone (FLONASE) 50 MCG/ACT nasal spray Place 2 sprays into both nostrils daily.   FOLIC ACID  PO Take 1,000 mg by mouth daily.   hydrochlorothiazide  (HYDRODIURIL ) 12.5 MG tablet Take 1 tablet (12.5 mg total) by mouth daily.   indomethacin  (INDOCIN  SR) 75 MG CR capsule Take 1 capsule (75 mg total) by mouth 2 (two) times daily with a meal.   NEEDLE, DISP, 14 G 14G X 1" MISC 1 each by Does not apply route as needed.   NEEDLE, DISP, 25 G 25G X 1" MISC Use as directed   omeprazole  (PRILOSEC) 20 MG capsule TAKE 1 CAPSULE DAILY   rosuvastatin  (CRESTOR ) 10 MG tablet Take 1 tablet (10 mg total) by mouth daily.    Syringe, Disposable, 3 ML MISC 1 each by Does not apply route once a week.   testosterone  cypionate (DEPOTESTOSTERONE CYPIONATE) 200 MG/ML injection Inject 0.5 mLs (100 mg total) into the muscle every 14 (fourteen) days.    Allergies: Patient has no known allergies. Family History: Patient family history includes Anxiety disorder in his father and maternal grandmother; Arthritis in his mother; Coronary artery disease in his father; Depression in his father and maternal grandmother; Hypertension in his father and paternal grandfather; Stroke in his father. Social History:  Patient  reports that he quit smoking about 15 years ago. His smoking use included cigarettes. He has never used smokeless tobacco. He reports current alcohol use of about 2.0 standard drinks of alcohol per week. He reports that he does  not use drugs.  Review of Systems: Constitutional: Negative for fever malaise or anorexia Cardiovascular: negative for chest pain Respiratory: negative for SOB or persistent cough Gastrointestinal: negative for abdominal pain  Objective  Vitals: BP 110/76   Pulse 71   Temp 98.2 F (36.8 C)   Ht 5\' 6"  (1.676 m)   Wt 151 lb 3.2 oz (68.6 kg)   SpO2 99%   BMI 24.40 kg/m  General: no acute distress , A&Ox3, no jaundice HEENT: PEERL, conjunctiva normal, neck is supple Cardiovascular:  RRR without murmur or gallop.  Respiratory:  Good breath sounds bilaterally, CTAB with normal respiratory effort Gastrointestinal: soft, flat abdomen, normal active bowel sounds, no palpable masses, no hepatosplenomegaly, no appreciated hernias RUQ ttp w/o rebound, guarding or mass Skin:  Warm, no rashes  Commons side effects, risks, benefits, and alternatives for medications and treatment plan prescribed today were discussed, and the patient expressed understanding of the given instructions. Patient is instructed to call or message via MyChart if he/she has any questions or concerns regarding our treatment  plan. No barriers to understanding were identified. We discussed Red Flag symptoms and signs in detail. Patient expressed understanding regarding what to do in case of urgent or emergency type symptoms.  Medication list was reconciled, printed and provided to the patient in AVS. Patient instructions and summary information was reviewed with the patient as documented in the AVS. This note was prepared with assistance of Dragon voice recognition software. Occasional wrong-word or sound-a-like substitutions may have occurred due to the inherent limitations of voice recognition software

## 2023-09-17 NOTE — Progress Notes (Signed)
 See mychart note Avis, Your lab results are unremarkable. Your liver tests all look fine. There is no clear sign of infection or inflammation of the pancreas.  I will await the stool test results and the ultrasound.  This may be an IBS flare; we can consider a trial of an antispasmodic medication while we await the other test results. Let me know.  Sincerely, Dr. Jonelle Neri

## 2023-09-18 ENCOUNTER — Ambulatory Visit
Admission: RE | Admit: 2023-09-18 | Discharge: 2023-09-18 | Disposition: A | Discharge status: Home / Self Care | Source: Ambulatory Visit | Attending: Family Medicine | Admitting: Family Medicine

## 2023-09-18 DIAGNOSIS — R1011 Right upper quadrant pain: Secondary | ICD-10-CM

## 2023-09-18 DIAGNOSIS — R197 Diarrhea, unspecified: Secondary | ICD-10-CM

## 2023-09-20 LAB — CALPROTECTIN, FECAL: Calprotectin, Fecal: 25 ug/g (ref 0–120)

## 2023-09-22 LAB — GASTROINTESTINAL PATHOGEN PNL
CampyloBacter Group: NOT DETECTED
Norovirus GI/GII: NOT DETECTED
Rotavirus A: NOT DETECTED
Salmonella species: NOT DETECTED
Shiga Toxin 1: NOT DETECTED
Shiga Toxin 2: NOT DETECTED
Shigella Species: NOT DETECTED
Vibrio Group: NOT DETECTED
Yersinia enterocolitica: NOT DETECTED

## 2023-09-22 NOTE — Progress Notes (Signed)
 See mychart note Bradley Chandler, No infection found on stool studies at this time. Could have been viral in nature or IBS. I believe you are improved. However, if diarrhea returns or if persists, then I would check for a bacterial infection called C.diff. and would need another stool sample to check.    Sincerely, Dr. Jonelle Neri

## 2023-09-24 ENCOUNTER — Other Ambulatory Visit

## 2023-09-24 DIAGNOSIS — R197 Diarrhea, unspecified: Secondary | ICD-10-CM

## 2023-09-25 LAB — CLOSTRIDIUM DIFFICILE TOXIN B, QUALITATIVE, REAL-TIME PCR: Toxigenic C. Difficile by PCR: NOT DETECTED

## 2023-09-28 ENCOUNTER — Encounter: Payer: Self-pay | Admitting: Family Medicine

## 2023-09-28 NOTE — Progress Notes (Signed)
 See mychart note Dear Mr. Or, C. Diff is also negative. We will get you to GI for further evaluation.  Sincerely, Dr. Jonelle Neri

## 2023-09-29 NOTE — Progress Notes (Unsigned)
 se     Brigitte Canard, PA-C [address] [place], [place]  [address] Phone: [phone]   Primary Care Physician: Luevenia Saha, MD  Primary Gastroenterologist:  Brigitte Canard, PA-C / Darol Elizabeth, MD   Chief Complaint: Follow-up diarrhea and nausea       HPI:   Millen Gehres is a 53 y.o. male, established patient Dr. Cherryl Corona, returns for follow-up of diarrhea and nausea.  Patient saw his PCP, Dr. Jonelle Neri on 09/17/2023, to evaluate acute diarrhea and abdominal pain for 1 week.  Omeprazole  was increased to twice daily dose.  CBC, CMP, and lipase labs normal.  Stool studies showed negative GI pathogen panel, negative C. difficile toxin PCR, and normal fecal calprotectin.  Current symptoms: Patient reports mild intermittent epigastric pain and lower abdominal burning for 3 weeks.  He has had frequent episodes of diarrhea with waves of nausea.  Intermittent lower abdominal cramping.  Denies vomiting, melena, or hematochezia.  Stools are formed and loose.  He had 8 BMs yesterday.  Light-colored stools.  Denies any recent antibiotics, travel, or sick exposures.  He has city water.  He does have increased diarrhea if he drinks alcohol.  No other specific food triggers.  No new medications.  Denies fever or chills.  He will be leaving to go to [place]  1 June until November 2025.  09/22/2022 Colonoscopy by Dr. Cherryl Corona: 1 small tubular adeoma polyp removed.  Moderate diverticulosis in the sigmoid and descending colon.  Good prep with Suprep.  7 year repeat.  06/2015 EGD at [facility] (for dysphagia): Mild Schatzki's ring at GE junction dilated to 18 mm, savory dilator.  LA grade a esophagitis.  Mild gastritis.  Current Outpatient Medications  Medication Sig Dispense Refill   aspirin  EC 81 MG tablet Take 1 tablet (81 mg total) by mouth daily.     baclofen  (LIORESAL ) 10 MG tablet Take 1 tablet (10 mg total) by mouth daily as needed for muscle spasms. 90 each 3   buPROPion  (WELLBUTRIN  XL) 300 MG  24 hr tablet Take 1 tablet (300 mg total) by mouth daily. 90 tablet 3   carvedilol  (COREG ) 6.25 MG tablet Take 1 tablet (6.25 mg total) by mouth 2 (two) times daily with a meal. 180 tablet 3   Cholecalciferol 25 MCG (1000 UT) capsule Take 1,000 Units by mouth daily.     clonazePAM  (KLONOPIN ) 0.5 MG disintegrating tablet Take 1-2 tablets (0.5-1 mg total) by mouth 2 (two) times daily as needed (sleep or anxiety). 60 tablet 1   dicyclomine (BENTYL) 10 MG capsule Take 1 capsule (10 mg total) by mouth 3 (three) times daily before meals. 90 capsule 12   Efavirenz -lamiVUDine -Tenofovir  600-300-300 MG TABS Take 1 tablet by mouth daily. 90 tablet 3   escitalopram  (LEXAPRO ) 10 MG tablet Take 1 tablet (10 mg total) by mouth daily. 90 tablet 3   fluticasone (FLONASE) 50 MCG/ACT nasal spray Place 2 sprays into both nostrils daily.     FOLIC ACID  PO Take 1,000 mg by mouth daily.     hydrochlorothiazide  (HYDRODIURIL ) 12.5 MG tablet Take 1 tablet (12.5 mg total) by mouth daily. 90 tablet 3   indomethacin  (INDOCIN  SR) 75 MG CR capsule Take 1 capsule (75 mg total) by mouth 2 (two) times daily with a meal. 30 capsule 1   NEEDLE, DISP, 14 G 14G X 1" MISC 1 each by Does not apply route as needed. 100 each 5   NEEDLE, DISP, 25 G 25G X 1" MISC Use  as directed 25 each 3   omeprazole  (PRILOSEC) 20 MG capsule TAKE 1 CAPSULE DAILY 90 capsule 3   rosuvastatin  (CRESTOR ) 10 MG tablet Take 1 tablet (10 mg total) by mouth daily. 90 tablet 3   Syringe, Disposable, 3 ML MISC 1 each by Does not apply route once a week. 25 each 1   testosterone  cypionate (DEPOTESTOSTERONE CYPIONATE) 200 MG/ML injection Inject 0.5 mLs (100 mg total) into the muscle every 14 (fourteen) days. 3 mL 3   No current facility-administered medications for this visit.    Allergies as of 09/30/2023   (No Known Allergies)    Past Medical History:  Diagnosis Date   Benign essential HTN 10/10/2015   Blood transfusion without reported diagnosis 1997   GAD  (generalized anxiety disorder) 10/10/2015   History of esophagogastroduodenoscopy (EGD) 10/10/2015   History of kidney stones    HIV disease (HCC) 10/10/2015   Mixed hyperlipidemia 07/10/2023   Neuroma    s/p craniotomy   Pneumonia    Schatzki's ring 10/10/2015   Seasonal allergies 10/10/2015   Secondary polycythemia 07/06/2023   Situational depression 10/10/2015   Stroke (HCC)    Vaccine counseling 07/06/2023    Past Surgical History:  Procedure Laterality Date   BRAIN SURGERY     CO2 LASER APPLICATION Left 03/21/2022   Procedure: CO2 LASER OF PENILE PAPULES;  Surgeon: Homero Luster, MD;  Location: [facility];  Service: Urology;  Laterality: Left;   COLONOSCOPY     ESOPHAGOGASTRODUODENOSCOPY     FRACTURE SURGERY  2021   Pinky finger; Rt hand   INCISION AND DRAINAGE ABSCESS Left 03/21/2022   Procedure: EXCISION OF LEFT INGUINAL ABSESS,EXCISION OF SUPRAPUBIC FOLLICULITIS;  Surgeon: Homero Luster, MD;  Location[facility];  Service: Urology;  Laterality: Left;    Review of Systems:    All systems reviewed and negative except where noted in HPI.    Physical Exam:  BP 110/60   Pulse 65   Ht 5\' 6"  (1.676 m)   Wt 155 lb (70.3 kg)   BMI 25.02 kg/m  No LMP for male patient.  General: Well-nourished, well-developed in no acute distress.  Lungs: Clear to auscultation bilaterally. Non-labored. Heart: Regular rate and rhythm, no murmurs rubs or gallops.  Abdomen: Bowel sounds are normal; Abdomen is Soft; No hepatosplenomegaly, masses or hernias; mild epigastric and bilateral lower abdominal Tenderness; No guarding or rebound tenderness. Neuro: Alert and oriented x 3.  Grossly intact.  Psych: Alert and cooperative, normal mood and affect.  Imaging Studies: US  ABDOMEN LIMITED RUQ (LIVER/GB) Result Date: 09/18/2023 CLINICAL DATA:  Right upper quadrant pain and diarrhea. EXAM: ULTRASOUND ABDOMEN LIMITED RIGHT UPPER QUADRANT COMPARISON:  None Available.  FINDINGS: Gallbladder: No gallstones or wall thickening visualized. No sonographic Murphy sign noted by sonographer. Common bile duct: Diameter: 4.7 mm. Liver: No focal lesion identified. Within normal limits in parenchymal echogenicity. Portal vein is patent on color Doppler imaging with normal direction of blood flow towards the liver. Other: None. IMPRESSION: Normal right upper quadrant ultrasound. Electronically Signed   By: Anna Barnes M.D.   On: 09/18/2023 15:08    Labs: CBC    Component Value Date/Time   WBC 5.2 09/17/2023 0824   RBC 4.69 09/17/2023 0824   HGB 15.9 09/17/2023 0824   HCT 45.8 09/17/2023 0824   PLT 265.0 09/17/2023 0824   MCV 97.7 09/17/2023 0824   MCH 34.0 05/07/2021 0720   MCHC 34.8 09/17/2023 0824   RDW 14.7 09/17/2023  0824   LYMPHSABS 1.6 09/17/2023 0824   MONOABS 0.4 09/17/2023 0824   EOSABS 0.2 09/17/2023 0824   BASOSABS 0.0 09/17/2023 0824    CMP     Component Value Date/Time   NA 136 09/17/2023 0824   K 3.9 09/17/2023 0824   CL 103 09/17/2023 0824   CO2 25 09/17/2023 0824   GLUCOSE 112 (H) 09/17/2023 0824   BUN 19 09/17/2023 0824   CREATININE 1.03 09/17/2023 0824   CALCIUM  9.4 09/17/2023 0824   PROT 7.1 09/17/2023 0824   ALBUMIN 4.8 09/17/2023 0824   AST 28 09/17/2023 0824   ALT 32 09/17/2023 0824   ALKPHOS 72 09/17/2023 0824   BILITOT 0.6 09/17/2023 0824   GFRNONAA >60 05/07/2021 0720       Assessment and Plan:   Kvion Lell is a 53 y.o. y/o male presents for evaluation of:  Diarrhea  Lower abdominal cramping Epigastric discomfort Nausea without vomiting IBS-D GERD  Symptoms are most consistent with irritable bowel syndrome, GERD, and gastritis.  Celiac, alpha gal, H. pylori, and food allergies are also in the differential.  I am ordering labs and giving trial of treatment with close follow-up.  He has no alarm symptoms.  Plan: - Labs: Alpha Gal, Celiac screen, Food allergy Panal -H. Pylori Stool Test -Rx dicyclomine 10mg   TID prn cramping / diarrhea, #90, 12 RF -Align Probiotic 1 capsule once daily - Low FODMAP diet. - Continue omeprazole  20 Mg twice daily. - Avoid alcohol, NSAIDs, spicy and acidic foods. - If upper GI symptoms persist, then consider EGD.  Colonoscopy is up-to-date.  Brigitte Canard, PA-C  Follow up in 4 weeks with TG.

## 2023-09-30 ENCOUNTER — Other Ambulatory Visit

## 2023-09-30 ENCOUNTER — Ambulatory Visit: Admitting: Physician Assistant

## 2023-09-30 ENCOUNTER — Encounter: Payer: Self-pay | Admitting: Physician Assistant

## 2023-09-30 VITALS — BP 110/60 | HR 65 | Ht 66.0 in | Wt 155.0 lb

## 2023-09-30 DIAGNOSIS — R1030 Lower abdominal pain, unspecified: Secondary | ICD-10-CM | POA: Diagnosis not present

## 2023-09-30 DIAGNOSIS — R110 Nausea: Secondary | ICD-10-CM | POA: Diagnosis not present

## 2023-09-30 DIAGNOSIS — K219 Gastro-esophageal reflux disease without esophagitis: Secondary | ICD-10-CM

## 2023-09-30 DIAGNOSIS — R1013 Epigastric pain: Secondary | ICD-10-CM | POA: Diagnosis not present

## 2023-09-30 DIAGNOSIS — K580 Irritable bowel syndrome with diarrhea: Secondary | ICD-10-CM

## 2023-09-30 DIAGNOSIS — R197 Diarrhea, unspecified: Secondary | ICD-10-CM

## 2023-09-30 DIAGNOSIS — R109 Unspecified abdominal pain: Secondary | ICD-10-CM | POA: Diagnosis not present

## 2023-09-30 DIAGNOSIS — K529 Noninfective gastroenteritis and colitis, unspecified: Secondary | ICD-10-CM | POA: Diagnosis not present

## 2023-09-30 MED ORDER — DICYCLOMINE HCL 10 MG PO CAPS: 10.0000 mg | ORAL_CAPSULE | Freq: Three times a day (TID) | ORAL | 12 refills | Status: AC

## 2023-09-30 NOTE — Patient Instructions (Addendum)
 Take 1 Capsule Once Daily for 30 days. If GI symptoms improve, then OK to continue Align. If GI symptoms do not improve after 30 days, then discontinue.   Your provider has requested that you go to the basement level for lab work before leaving today. Press "B" on the elevator. The lab is located at the first door on the left as you exit the elevator.  We have sent the following medications to your pharmacy for you to pick up at your convenience: Dicyclomine 10mg  3 times daily   Your provider has ordered "[employer]" stool testing for you. You have received a kit from our office today containing all necessary supplies to complete this test. Please carefully read the stool collection instructions provided in the kit before opening the accompanying materials. In addition, be sure there is a label providing your full name and date of birth on the "puritan opti-swab" tube that is supplied in the kit (if you do not see a label with this information on your test tube, please make us  aware before test collection!). After completing the test, you should secure the purtian tube into the specimen biohazard bag. The [facility] E-Req sheet (including date and time of specimen collection) should be placed into the outside pocket of the specimen biohazard bag and returned to the [facility] lab (basement floor of [facility]) within 3 days of collection. Please make sure to give the specimen to a staff member at the lab. DO NOT leave the specimen on the counter.   If the specimen date and time (can be found in the upper right boxed portion of the sheet) are not filled out on the E-Req sheet, the test will NOT be performed.   Please follow up sooner if symptoms increase or worsen  Due to recent changes in healthcare laws, you may see the results of your imaging and laboratory studies on MyChart before your provider has had a chance to review them.  We understand that in some cases there  may be results that are confusing or concerning to you. Not all laboratory results come back in the same time frame and the provider may be waiting for multiple results in order to interpret others.  Please give us  48 hours in order for your provider to thoroughly review all the results before contacting the office for clarification of your results.   _______________________________________________________  If your blood pressure at your visit was 140/90 or greater, please contact your primary care physician to follow up on this.  _______________________________________________________  If you are age 52 or older, your body mass index should be between 23-30. Your Body mass index is 25.02 kg/m. If this is out of the aforementioned range listed, please consider follow up with your Primary Care Provider.  If you are age 52 or younger, your body mass index should be between 19-25. Your Body mass index is 25.02 kg/m. If this is out of the aformentioned range listed, please consider follow up with your Primary Care Provider.   ________________________________________________________  The [facility] providers would like to encourage you to use MYCHART to communicate with providers for non-urgent requests or questions.  Due to long hold times on the telephone, sending your provider a message by [facility] may be a faster and more efficient way to get a response.  Please allow 48 business hours for a response.  Please remember that this is for non-urgent requests.  _______________________________________________________ Thank you for trusting me with your  gastrointestinal care!   Brigitte Canard, PA

## 2023-10-04 LAB — FOOD ALLERGY PROFILE
CLASS: 0
CLASS: 0
CLASS: 0
CLASS: 0
CLASS: 0
Class: 0
Egg White IgE: 0.1 kU/L — ABNORMAL HIGH
Fish Cod: <0.1 kU/L
Hazelnut: <0.1 kU/L
Milk IgE: 0.13 kU/L — ABNORMAL HIGH
Peanut IgE: <0.1 kU/L
Sesame Seed f10: <0.1 kU/L
Soybean IgE: <0.1 kU/L
Wheat IgE: <0.1 kU/L

## 2023-10-04 LAB — EGG COMPONENT PANEL REFLEX

## 2023-10-04 LAB — MILK COMPONENT PANEL RFLX

## 2023-10-04 LAB — ALPHA-GAL PANEL: GALACTOSE-ALPHA-1,3-GALACTOSE IGE*: <0.1 kU/L (ref <0.10)

## 2023-10-04 LAB — INTERPRETATION:

## 2023-10-04 LAB — CELIAC DISEASE AB SCREEN W/RFX
Antigliadin Abs, IgA: 2 U (ref 0–19)
IgA/Immunoglobulin A, Serum: 86 mg/dL — ABNORMAL LOW (ref 90–386)
Transglutaminase IgA: <2 U/mL (ref 0–3)

## 2023-10-12 ENCOUNTER — Telehealth: Payer: Self-pay

## 2023-10-12 NOTE — Telephone Encounter (Signed)
 I called pt to inform him of Negative H. Pylori stool test per Brigitte Canard PA-C. I spoke with pt's husband he is aware, will inform Mr. Buth and will call back with any questions comments of concerns.

## 2023-10-21 NOTE — Progress Notes (Signed)
 Agree with the assessment and plan as outlined by Brigitte Canard, PA-C.

## 2023-10-26 ENCOUNTER — Other Ambulatory Visit (INDEPENDENT_AMBULATORY_CARE_PROVIDER_SITE_OTHER)

## 2023-10-26 DIAGNOSIS — E291 Testicular hypofunction: Secondary | ICD-10-CM | POA: Diagnosis not present

## 2023-10-26 LAB — CBC WITH DIFFERENTIAL/PLATELET
Basophils Absolute: 0 10*3/uL (ref 0.0–0.1)
Basophils Relative: 0.6 % (ref 0.0–3.0)
Eosinophils Absolute: 0.3 10*3/uL (ref 0.0–0.7)
Eosinophils Relative: 4.5 % (ref 0.0–5.0)
HCT: 43.7 % (ref 39.0–52.0)
Hemoglobin: 15.3 g/dL (ref 13.0–17.0)
Lymphocytes Relative: 33.6 % (ref 12.0–46.0)
Lymphs Abs: 2 10*3/uL (ref 0.7–4.0)
MCHC: 35 g/dL (ref 30.0–36.0)
MCV: 95.4 fl (ref 78.0–100.0)
Monocytes Absolute: 0.4 10*3/uL (ref 0.1–1.0)
Monocytes Relative: 7 % (ref 3.0–12.0)
Neutro Abs: 3.2 10*3/uL (ref 1.4–7.7)
Neutrophils Relative %: 54.3 % (ref 43.0–77.0)
Platelets: 254 10*3/uL (ref 150.0–400.0)
RBC: 4.58 Mil/uL (ref 4.22–5.81)
RDW: 12.9 % (ref 11.5–15.5)
WBC: 5.9 10*3/uL (ref 4.0–10.5)

## 2023-10-27 LAB — TESTOSTERONE,FREE AND TOTAL
Testosterone, Free: 4.9 pg/mL — ABNORMAL LOW (ref 7.2–24.0)
Testosterone: 175 ng/dL — ABNORMAL LOW (ref 264–916)

## 2023-10-28 ENCOUNTER — Ambulatory Visit: Payer: Self-pay | Admitting: Family Medicine

## 2023-10-28 DIAGNOSIS — E291 Testicular hypofunction: Secondary | ICD-10-CM

## 2023-10-28 MED ORDER — TESTOSTERONE CYPIONATE 200 MG/ML IM SOLN: 150.0000 mg | INTRAMUSCULAR | Status: DC

## 2023-10-28 NOTE — Progress Notes (Signed)
 See mychart note Diamante, Your testosterone  levels have not increased with the restarting of the testosterone  100mg  every 2 weeks.  I recommend increasing to 150mg  every two weeks.  Can you get your levels rechecked in 8-10 weeks in [place]? Let me know.  Sincerely, Dr. Jonelle Neri

## 2023-10-30 ENCOUNTER — Ambulatory Visit: Admitting: Physician Assistant

## 2023-11-04 ENCOUNTER — Encounter: Payer: Self-pay | Admitting: Family Medicine

## 2023-11-04 NOTE — Telephone Encounter (Signed)
 Please see pt msg and advise on increase in testosterone  for patient

## 2023-12-01 ENCOUNTER — Encounter: Payer: Self-pay | Admitting: Family Medicine

## 2023-12-01 DIAGNOSIS — E291 Testicular hypofunction: Secondary | ICD-10-CM

## 2023-12-02 MED ORDER — ZOLPIDEM TARTRATE 10 MG PO TABS: 10.0000 mg | ORAL_TABLET | Freq: Every evening | ORAL | 1 refills | Status: DC | PRN

## 2023-12-02 MED ORDER — TESTOSTERONE CYPIONATE 200 MG/ML IM SOLN: 150.0000 mg | INTRAMUSCULAR | 3 refills | Status: DC

## 2023-12-14 ENCOUNTER — Other Ambulatory Visit: Payer: Self-pay | Admitting: Family Medicine

## 2023-12-21 ENCOUNTER — Other Ambulatory Visit: Payer: Self-pay | Admitting: Family Medicine

## 2023-12-28 ENCOUNTER — Encounter: Payer: Self-pay | Admitting: Family Medicine

## 2023-12-28 NOTE — Telephone Encounter (Signed)
Form placed in providers box 

## 2024-01-07 ENCOUNTER — Ambulatory Visit: Admitting: Family Medicine

## 2024-01-07 ENCOUNTER — Encounter: Payer: Self-pay | Admitting: Family Medicine

## 2024-01-07 VITALS — BP 126/88 | HR 64 | Temp 98.2°F | Ht 66.0 in | Wt 156.0 lb

## 2024-01-07 DIAGNOSIS — F411 Generalized anxiety disorder: Secondary | ICD-10-CM

## 2024-01-07 DIAGNOSIS — Z23 Encounter for immunization: Secondary | ICD-10-CM

## 2024-01-07 DIAGNOSIS — D751 Secondary polycythemia: Secondary | ICD-10-CM | POA: Diagnosis not present

## 2024-01-07 DIAGNOSIS — E291 Testicular hypofunction: Secondary | ICD-10-CM | POA: Diagnosis not present

## 2024-01-07 DIAGNOSIS — Z789 Other specified health status: Secondary | ICD-10-CM | POA: Diagnosis not present

## 2024-01-07 DIAGNOSIS — F339 Major depressive disorder, recurrent, unspecified: Secondary | ICD-10-CM

## 2024-01-07 DIAGNOSIS — G4709 Other insomnia: Secondary | ICD-10-CM

## 2024-01-07 DIAGNOSIS — K580 Irritable bowel syndrome with diarrhea: Secondary | ICD-10-CM | POA: Insufficient documentation

## 2024-01-07 LAB — CBC WITH DIFFERENTIAL/PLATELET
Basophils Absolute: 0 K/uL (ref 0.0–0.1)
Basophils Relative: 0.5 % (ref 0.0–3.0)
Eosinophils Absolute: 0.3 K/uL (ref 0.0–0.7)
Eosinophils Relative: 5.3 % — ABNORMAL HIGH (ref 0.0–5.0)
HCT: 48.6 % (ref 39.0–52.0)
Hemoglobin: 16.5 g/dL (ref 13.0–17.0)
Lymphocytes Relative: 30.1 % (ref 12.0–46.0)
Lymphs Abs: 1.5 K/uL (ref 0.7–4.0)
MCHC: 33.9 g/dL (ref 30.0–36.0)
MCV: 96.3 fl (ref 78.0–100.0)
Monocytes Absolute: 0.3 K/uL (ref 0.1–1.0)
Monocytes Relative: 6.8 % (ref 3.0–12.0)
Neutro Abs: 2.9 K/uL (ref 1.4–7.7)
Neutrophils Relative %: 57.3 % (ref 43.0–77.0)
Platelets: 255 K/uL (ref 150.0–400.0)
RBC: 5.04 Mil/uL (ref 4.22–5.81)
RDW: 12.7 % (ref 11.5–15.5)
WBC: 5.1 K/uL (ref 4.0–10.5)

## 2024-01-07 MED ORDER — TRAZODONE HCL 50 MG PO TABS: 25.0000 mg | ORAL_TABLET | Freq: Every evening | ORAL | 3 refills | Status: AC | PRN

## 2024-01-07 MED ORDER — ZEPBOUND 2.5 MG/0.5ML ~~LOC~~ SOAJ
2.5000 mg | SUBCUTANEOUS | 2 refills | Status: AC
Start: 2024-01-07 — End: ?

## 2024-01-07 MED ORDER — ESCITALOPRAM OXALATE 20 MG PO TABS: 20.0000 mg | ORAL_TABLET | Freq: Every day | ORAL | 3 refills | Status: DC

## 2024-01-07 NOTE — Patient Instructions (Signed)
 Please return in 6 months for your annual complete physical; please come fasting.   I will release your lab results to you on your MyChart account with further instructions. You may see the results before I do, but when I review them I will send you a message with my report or have my assistant call you if things need to be discussed. Please reply to my message with any questions. Thank you!   If you have any questions or concerns, please don't hesitate to send me a message via MyChart or call the office at [phone]. Thank you for visiting with us  today! It's our pleasure caring for you.

## 2024-01-07 NOTE — Progress Notes (Signed)
 Subjective  CC:  Chief Complaint  Patient presents with   Follow-up   Depression   Anxiety    HPI: Bradley Chandler is a 53 y.o. male who presents to the office today to address the problems listed above in the chief complaint.  Low T= feels levels remain too low. On 150 twice monthly. Most recent labs from June show low T (had stopped it) and cbc had normalized.  Has central weight gain; works out and struggling to lose belly fat. On lexapro  which has contributed.  IBS: definitely improved since spring. Reviewed gi eval (negative work up for allergies, gluten, celiac). Stress dependent.  Mood: much better on lexapro  but worse is especially stressful again and think he needs a little more help. On wellbutrin  and lexapro .  Insomnia: had some left over trazadone; working well now.   Assessment  1. Hypogonadism in male   2. Secondary polycythemia   3. Educated about management of weight   4. Irritable bowel syndrome with diarrhea   5. Need for pneumococcal 20-valent conjugate vaccination   6. GAD (generalized anxiety disorder)   7. Major depression, recurrent, chronic (HCC)   8. Secondary insomnia      Plan  Low T:  recheck levels and adjust dose as needed/tolerated by cbc. Understands risks vs benefits Weight mgt: low dose glp1 would be very helpful for him. Counseling and education given.  Mood mgt: increase lexapro  to 20mg  daily. Monitor for efficacy, apathy and weight gain.  Insomnia: glad trazodone  is working now. Will stop ambien .   I spent a total of 42 minutes for this patient encounter. Time spent included preparation, face-to-face counseling with the patient and coordination of care, review of chart and records, and documentation of the encounter.  preparing to see the patient, getting/reviewing separately obtained history, counseling and educating, placing orders, documenting clinical information in the EHR, independently interpreting results, and coordinating  care.   Follow up: 6 mo for cpe Visit date not found  Orders Placed This Encounter  Procedures   Pneumococcal conjugate vaccine 20-valent (Prevnar 20)   Testosterone ,Free and Total   CBC with Differential/Platelet   Meds ordered this encounter  Medications   escitalopram  (LEXAPRO ) 20 MG tablet    Sig: Take 1 tablet (20 mg total) by mouth daily.    Dispense:  90 tablet    Refill:  3    Increasing dose up from 10   tirzepatide  (ZEPBOUND ) 2.5 MG/0.5ML Pen    Sig: Inject 2.5 mg into the skin once a week.    Dispense:  2 mL    Refill:  2   traZODone  (DESYREL ) 50 MG tablet    Sig: Take 0.5-1 tablets (25-50 mg total) by mouth at bedtime as needed for sleep.    Dispense:  90 tablet    Refill:  3      I reviewed the patients updated PMH, FH, and SocHx.    Patient Active Problem List   Diagnosis Date Noted   Mixed hyperlipidemia 07/10/2023    Priority: High   Secondary polycythemia 07/06/2023    Priority: High   Major depression, recurrent, chronic (HCC) 06/17/2022    Priority: High   Hypogonadism in male 01/16/2022    Priority: High   Transient homonymous hemianopia, right 05/16/2021    Priority: High   Basal ganglia infarction (HCC) 05/07/2021    Priority: High   HIV disease (HCC) 10/10/2015    Priority: High   GAD (generalized anxiety disorder) 10/10/2015  Priority: High   Essential hypertension 10/10/2015    Priority: High   Irritable bowel syndrome with diarrhea 01/07/2024    Priority: Medium    Tubular adenoma of colon 12/24/2022    Priority: Medium    H/O gastritis 06/17/2022    Priority: Medium    Secondary insomnia 06/17/2022    Priority: Medium    Schatzki's ring 10/10/2015    Priority: Medium    Morton's neuroma, left 10/10/2015    Priority: Medium    Vitamin B12 deficiency 06/03/2023    Priority: Low   Seasonal allergies 10/10/2015    Priority: Low   Folate deficiency 06/03/2023   Current Meds  Medication Sig   aspirin  EC 81 MG tablet Take 1  tablet (81 mg total) by mouth daily.   baclofen  (LIORESAL ) 10 MG tablet Take 1 tablet (10 mg total) by mouth daily as needed for muscle spasms.   buPROPion  (WELLBUTRIN  XL) 300 MG 24 hr tablet Take 1 tablet (300 mg total) by mouth daily.   carvedilol  (COREG ) 6.25 MG tablet TAKE 1 TABLET TWICE A DAY WITH MEALS   Cholecalciferol 25 MCG (1000 UT) capsule Take 1,000 Units by mouth daily.   clonazePAM  (KLONOPIN ) 0.5 MG disintegrating tablet Take 1-2 tablets (0.5-1 mg total) by mouth 2 (two) times daily as needed (sleep or anxiety).   dicyclomine  (BENTYL ) 10 MG capsule Take 1 capsule (10 mg total) by mouth 3 (three) times daily before meals.   Efavirenz -lamiVUDine -Tenofovir  600-300-300 MG TABS Take 1 tablet by mouth daily.   fluticasone (FLONASE) 50 MCG/ACT nasal spray Place 2 sprays into both nostrils daily.   FOLIC ACID  PO Take 1,000 mg by mouth daily.   hydrochlorothiazide  (HYDRODIURIL ) 12.5 MG tablet TAKE 1 TABLET DAILY   indomethacin  (INDOCIN  SR) 75 MG CR capsule Take 1 capsule (75 mg total) by mouth 2 (two) times daily with a meal.   NEEDLE, DISP, 14 G 14G X 1 MISC 1 each by Does not apply route as needed.   NEEDLE, DISP, 25 G 25G X 1 MISC Use as directed   omeprazole  (PRILOSEC) 20 MG capsule TAKE 1 CAPSULE DAILY   rosuvastatin  (CRESTOR ) 10 MG tablet Take 1 tablet (10 mg total) by mouth daily.   Syringe, Disposable, 3 ML MISC 1 each by Does not apply route once a week.   testosterone  cypionate (DEPOTESTOSTERONE CYPIONATE) 200 MG/ML injection Inject 0.75 mLs (150 mg total) into the muscle every 14 (fourteen) days.   tirzepatide  (ZEPBOUND ) 2.5 MG/0.5ML Pen Inject 2.5 mg into the skin once a week.   traZODone  (DESYREL ) 50 MG tablet Take 0.5-1 tablets (25-50 mg total) by mouth at bedtime as needed for sleep.   [DISCONTINUED] escitalopram  (LEXAPRO ) 10 MG tablet Take 1 tablet (10 mg total) by mouth daily.   [DISCONTINUED] zolpidem  (AMBIEN ) 10 MG tablet Take 1 tablet (10 mg total) by mouth at bedtime  as needed for sleep.    Allergies: Patient has no known allergies. Family History: Patient family history includes Anxiety disorder in his father and maternal grandmother; Arthritis in his mother; Coronary artery disease in his father; Depression in his father and maternal grandmother; Hypertension in his father and paternal grandfather; Stroke in his father. Social History:  Patient  reports that he quit smoking about 15 years ago. His smoking use included cigarettes. He has never used smokeless tobacco. He reports current alcohol use of about 2.0 standard drinks of alcohol per week. He reports that he does not use drugs.  Review of Systems: Constitutional:  Negative for fever malaise or anorexia Cardiovascular: negative for chest pain Respiratory: negative for SOB or persistent cough Gastrointestinal: negative for abdominal pain  Objective  Vitals: BP 126/88   Pulse 64   Temp 98.2 F (36.8 C)   Ht 5' 6 (1.676 m)   Wt 156 lb (70.8 kg)   SpO2 97%   BMI 25.18 kg/m  General: no acute distress , A&Ox3 HEENT: PEERL, conjunctiva normal, neck is supple Cardiovascular:  RRR without murmur or gallop.  Respiratory:  Good breath sounds bilaterally, CTAB with normal respiratory effort Skin:  Warm, no rashes  Commons side effects, risks, benefits, and alternatives for medications and treatment plan prescribed today were discussed, and the patient expressed understanding of the given instructions. Patient is instructed to call or message via MyChart if he/she has any questions or concerns regarding our treatment plan. No barriers to understanding were identified. We discussed Red Flag symptoms and signs in detail. Patient expressed understanding regarding what to do in case of urgent or emergency type symptoms.  Medication list was reconciled, printed and provided to the patient in AVS. Patient instructions and summary information was reviewed with the patient as documented in the AVS. This note  was prepared with assistance of Dragon voice recognition software. Occasional wrong-word or sound-a-like substitutions may have occurred due to the inherent limitations of voice recognition software

## 2024-01-12 LAB — TESTOSTERONE,FREE AND TOTAL
Testosterone, Free: 2 pg/mL — AB (ref 7.2–24.0)
Testosterone: 104 ng/dL — ABNORMAL LOW (ref 264–916)

## 2024-01-13 ENCOUNTER — Other Ambulatory Visit (HOSPITAL_COMMUNITY): Payer: Self-pay

## 2024-01-17 ENCOUNTER — Ambulatory Visit: Payer: Self-pay | Admitting: Family Medicine

## 2024-01-17 NOTE — Progress Notes (Signed)
 See mychart note

## 2024-01-19 ENCOUNTER — Other Ambulatory Visit: Payer: Self-pay | Admitting: Family Medicine

## 2024-04-19 NOTE — Progress Notes (Signed)
 The ASCVD Risk score (Arnett DK, et al., 2019) failed to calculate for the following reasons:   Risk score cannot be calculated because patient has a medical history suggesting prior/existing ASCVD  Arlon Bergamo, BSN, RN

## 2024-05-02 NOTE — Progress Notes (Deleted)
 Subjective:  Chief complaint: follow-up for HIV disease on medications   Patient ID: Bradley Chandler, male    DOB: [birth year], 53 y.o.   MRN: [id number]  HPI  Past Medical History:  Diagnosis Date   Benign essential HTN 10/10/2015   Blood transfusion without reported diagnosis 1997   GAD (generalized anxiety disorder) 10/10/2015   History of esophagogastroduodenoscopy (EGD) 10/10/2015   History of kidney stones    HIV disease (HCC) 10/10/2015   Mixed hyperlipidemia 07/10/2023   Neuroma    s/p craniotomy   Pneumonia    Schatzki's ring 10/10/2015   Seasonal allergies 10/10/2015   Secondary polycythemia 07/06/2023   Situational depression 10/10/2015   Stroke (HCC)    Vaccine counseling 07/06/2023    Past Surgical History:  Procedure Laterality Date   BRAIN SURGERY     CO2 LASER APPLICATION Left 03/21/2022   Procedure: CO2 LASER OF PENILE PAPULES;  Surgeon: Watt Norleen, MD;  Location: [facility];  Service: Urology;  Laterality: Left;   COLONOSCOPY     ESOPHAGOGASTRODUODENOSCOPY     FRACTURE SURGERY  2021   Pinky finger; Rt hand   INCISION AND DRAINAGE ABSCESS Left 03/21/2022   Procedure: EXCISION OF LEFT INGUINAL ABSESS,EXCISION OF SUPRAPUBIC FOLLICULITIS;  Surgeon: Watt Norleen, MD;  Location: [facility];  Service: Urology;  Laterality: Left;    Family History  Problem Relation Age of Onset   Arthritis Mother    Hypertension Father    Coronary artery disease Father    Stroke Father    Anxiety disorder Father    Depression Father    Anxiety disorder Maternal Grandmother    Depression Maternal Grandmother    Hypertension Paternal Grandfather    Colon cancer Neg Hx    Colon polyps Neg Hx    Esophageal cancer Neg Hx    Rectal cancer Neg Hx    Stomach cancer Neg Hx       Social History   Socioeconomic History   Marital status: Married    Spouse name: Camellia Bame   Number of children: 0   Years of education: Not on file   Highest  education level: Professional school degree (e.g., MD, DDS, DVM, JD)  Occupational History   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 Moved to [place] w/partner (since 1998). 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       House in [place]. No one else in home. Husband works at [employer]. Not interested in kids; godfather for a friend's child.      Housing - in house with partner/spouse in [facility] / Work & Benefits - not in school, employed (remotely with [employer]) and insured through employer      Tobacco - former smoker, less than 20 P-Y total, quit 2015-ish   Alcohol - as of 01/2021 - no EtOH during week. Usually drink w/eating out on weekend. At a sitting 2 max (mixed).   Substance use - none    Social Drivers of Corporate Investment Banker Strain: Low Risk  (01/05/2024)   Overall Financial Resource Strain (CARDIA)    Difficulty of Paying Living Expenses: Not hard at all  Food Insecurity: No Food Insecurity (01/05/2024)   Hunger Vital Sign    Worried About Running Out of Food in the Last Year: Never true    Ran Out of Food in the Last Year: Never true  Transportation Needs: No Transportation Needs (01/05/2024)   PRAPARE - Administrator, Civil Service (Medical): No    Lack  of Transportation (Non-Medical): No  Physical Activity: Sufficiently Active (01/05/2024)   Exercise Vital Sign    Days of Exercise per Week: 5 days    Minutes of Exercise per Session: 90 min  Stress: Stress Concern Present (01/05/2024)   [employer] of Occupational Health - Occupational Stress Questionnaire    Feeling of Stress: To some extent  Social Connections: Moderately Isolated (01/05/2024)   Social Connection and Isolation Panel    Frequency of Communication with Friends and Family: More than three times a week    Frequency of Social Gatherings with Friends and Family: Once a week    Attends Religious Services: Never    Database Administrator or Organizations: No    Attends Engineer, Structural: Not on file    Marital Status: Married    No Known Allergies   Current Outpatient Medications:    aspirin  EC 81 MG tablet, Take 1 tablet (81 mg total) by mouth daily., Disp: , Rfl:    baclofen  (LIORESAL ) 10 MG tablet, Take 1 tablet (10 mg total) by mouth daily as needed for muscle spasms., Disp: 90 each, Rfl: 3   buPROPion  (WELLBUTRIN  XL) 300 MG 24 hr tablet, Take 1 tablet (300 mg total) by mouth daily., Disp: 90 tablet, Rfl: 3   carvedilol  (COREG ) 6.25 MG tablet, TAKE 1 TABLET TWICE A DAY WITH MEALS, Disp: 180 tablet, Rfl: 3   Cholecalciferol 25 MCG (1000 UT) capsule, Take 1,000 Units by mouth daily., Disp: , Rfl:    clonazePAM  (KLONOPIN ) 0.5 MG disintegrating tablet, Take 1-2 tablets (0.5-1 mg total) by mouth 2 (two) times daily as needed (sleep or anxiety)., Disp: 60 tablet, Rfl: 1   dicyclomine  (BENTYL ) 10 MG capsule, Take 1 capsule (10 mg total) by mouth 3 (three) times daily before meals., Disp: 90 capsule, Rfl: 12   Efavirenz -lamiVUDine -Tenofovir  600-300-300 MG TABS, Take 1 tablet by mouth daily., Disp: 90 tablet, Rfl: 3   escitalopram  (LEXAPRO ) 20 MG tablet, Take 1 tablet (20 mg total) by mouth daily., Disp: 90 tablet, Rfl: 3   fluticasone (FLONASE) 50 MCG/ACT nasal  spray, Place 2 sprays into both nostrils daily., Disp: , Rfl:    FOLIC ACID  PO, Take 1,000 mg by mouth daily., Disp: , Rfl:    hydrochlorothiazide  (HYDRODIURIL ) 12.5 MG tablet, TAKE 1 TABLET DAILY, Disp: 90 tablet, Rfl: 3   indomethacin  (INDOCIN  SR) 75 MG CR capsule, Take 1 capsule (75 mg total) by mouth 2 (two) times daily with a meal., Disp: 30 capsule, Rfl: 1   NEEDLE, DISP, 14 G 14G X 1 MISC, 1 each by Does not  apply route as needed., Disp: 100 each, Rfl: 5   NEEDLE, DISP, 25 G 25G X 1 MISC, Use as directed, Disp: 25 each, Rfl: 3   omeprazole  (PRILOSEC) 20 MG capsule, TAKE 1 CAPSULE DAILY, Disp: 90 capsule, Rfl: 3   rosuvastatin  (CRESTOR ) 10 MG tablet, Take 1 tablet (10 mg total) by mouth daily., Disp: 90 tablet, Rfl: 3   Syringe, Disposable, 3 ML MISC, 1 each by Does not apply route once a week., Disp: 25 each, Rfl: 1   testosterone  cypionate (DEPOTESTOSTERONE CYPIONATE) 200 MG/ML injection, Inject 0.75 mLs (150 mg total) into the muscle every 14 (fourteen) days., Disp: 10 mL, Rfl: 3   tirzepatide  (ZEPBOUND ) 2.5 MG/0.5ML Pen, Inject 2.5 mg into the skin once a week., Disp: 2 mL, Rfl: 2   traZODone  (DESYREL ) 50 MG tablet, Take 0.5-1 tablets (25-50 mg total) by mouth at bedtime as needed for sleep., Disp: 90 tablet, Rfl: 3   Review of Systems     Objective:   Physical Exam        Assessment & Plan:

## 2024-05-03 ENCOUNTER — Ambulatory Visit: Payer: BC Managed Care – PPO | Admitting: Infectious Disease

## 2024-05-05 ENCOUNTER — Encounter: Payer: Self-pay | Admitting: Family Medicine

## 2024-05-05 ENCOUNTER — Ambulatory Visit: Admitting: Family Medicine

## 2024-05-05 VITALS — BP 120/86 | HR 78 | Temp 98.6°F | Ht 66.0 in | Wt 156.8 lb

## 2024-05-05 DIAGNOSIS — F339 Major depressive disorder, recurrent, unspecified: Secondary | ICD-10-CM | POA: Diagnosis not present

## 2024-05-05 DIAGNOSIS — I10 Essential (primary) hypertension: Secondary | ICD-10-CM

## 2024-05-05 DIAGNOSIS — G5762 Lesion of plantar nerve, left lower limb: Secondary | ICD-10-CM

## 2024-05-05 DIAGNOSIS — D751 Secondary polycythemia: Secondary | ICD-10-CM

## 2024-05-05 DIAGNOSIS — B20 Human immunodeficiency virus [HIV] disease: Secondary | ICD-10-CM | POA: Diagnosis not present

## 2024-05-05 DIAGNOSIS — E291 Testicular hypofunction: Secondary | ICD-10-CM | POA: Diagnosis not present

## 2024-05-05 LAB — CBC WITH DIFFERENTIAL/PLATELET
Basophils Absolute: 0 K/uL (ref 0.0–0.1)
Basophils Relative: 0.6 % (ref 0.0–3.0)
Eosinophils Absolute: 0.3 K/uL (ref 0.0–0.7)
Eosinophils Relative: 4.8 % (ref 0.0–5.0)
HCT: 46 % (ref 39.0–52.0)
Hemoglobin: 16.1 g/dL (ref 13.0–17.0)
Lymphocytes Relative: 34.6 % (ref 12.0–46.0)
Lymphs Abs: 2.1 K/uL (ref 0.7–4.0)
MCHC: 35 g/dL (ref 30.0–36.0)
MCV: 95.7 fl (ref 78.0–100.0)
Monocytes Absolute: 0.4 K/uL (ref 0.1–1.0)
Monocytes Relative: 6.7 % (ref 3.0–12.0)
Neutro Abs: 3.2 K/uL (ref 1.4–7.7)
Neutrophils Relative %: 53.3 % (ref 43.0–77.0)
Platelets: 270 K/uL (ref 150.0–400.0)
RBC: 4.8 Mil/uL (ref 4.22–5.81)
RDW: 13.1 % (ref 11.5–15.5)
WBC: 6 K/uL (ref 4.0–10.5)

## 2024-05-05 MED ORDER — TESTOSTERONE CYPIONATE 200 MG/ML IM SOLN: 100.0000 mg | INTRAMUSCULAR | 1 refills | Status: AC

## 2024-05-06 NOTE — Progress Notes (Signed)
 Subjective  CC:  Chief Complaint  Patient presents with   Follow-up    Hypogonadism(testosterone )    HPI: Bradley Chandler is a 53 y.o. male who presents to the office today to address the problems listed above in the chief complaint. Discussed the use of AI scribe software for clinical note transcription with the patient, who gave verbal consent to proceed.  History of Present Illness Bradley Chandler is a 53 year old male with low testosterone  and HIV who presents for evaluation of testosterone  levels and mental health concerns.  Hypogonadism - Low testosterone  levels identified in August - Has not taken testosterone  medication for two to three months due to limited supply (one vial left) - Previously missed a week of medication - Requests baseline blood test to adjust dosage - Feels unwell without testosterone  replacement, fatigue, weight gain, decreased mm mass - would like to restart.  - had been on 100 weekly in past with borderline high levels and mild polycythemia. Low levels occurred with lower doses.   Depressive symptoms - Has not taken Lexapro  for approximately two months due to concerns about weight gain - Experiencing decline in mental health, returning to previous baseline described as 'not good' - Currently taking Symfi for HIV, which has depressive side effects - Previously switched from TDF to TAF to reduce mental health side effects and kidney impact, but experienced significant weight gain - Exploring newer HIV medications without TAF or TDF due to concerns about mental health and weight gain  Musculoskeletal pain/ mortons neuroma - Experiencing pain in foot, worsened by certain activities and positions (e.g., placing one foot on top of the other while sleeping) - Pain has increased and now affects daily activities, such as assembling bookshelves  Weight gain - Current weight is 156 pounds, increased from previous best shape at 135 pounds - Attributes weight gain  to medication side effects and sedentary lifestyle - Aims to lose 10 to 20 pounds  HTN is controled on meds. No cp or sob  HIV : reviewed recent ID notes. Discussed changing antivirals as above.   Assessment  1. Hypogonadism in male   2. Secondary polycythemia   3. Major depression, recurrent, chronic   4. Morton's neuroma, left   5. HIV disease (HCC)   6. Essential hypertension      Plan  Assessment and Plan Assessment & Plan Hypogonadism in male Testosterone  levels were low in August. He has not been taking testosterone  for 2-3 months due to running out of medication. Reports feeling unwell without testosterone  therapy. - Ordered baseline testosterone  and hemoglobin levels - Restart testosterone  at 100 weekly IM - Will schedule follow-up blood test in 6 weeks - monitor CBC  Major depressive disorder, recurrent He has been off Lexapro  for 2 months due to concerns about side effects. Reports returning to previous mental state, which is not favorable. Discussed potential benefits of restarting Lexapro , especially with the addition of GLP-1 therapy for weight management. - Restart Lexapro  at 10 mg daily  Obesity/ overweight Weight gain attributed to medication side effects and sedentary lifestyle. Current weight is 156 lbs, with a goal to lose 10-20 lbs. Discussed potential benefits of GLP-1 therapy for weight loss and mental health improvement. He plans to start GLP-1 therapy in January with new [employer] funds. - Initiate GLP-1 therapy with tirzepatide  starting January 1st - Provided link for GLP-1 therapy questionnaire  Chronic foot pain/ morton's neuroma Experiencing chronic foot pain, exacerbated by activities such as sleeping and assembling furniture.  Scheduled to see a surgeon on January 13th for potential neurectomy. MRI of the foot may be needed for further evaluation. - Ordered MRI of the foot - Scheduled follow-up with surgeon on January 13th  HIV remains controlled. Will  f/u with ID  Follow up: 6 weeks for recheck Orders Placed This Encounter  Procedures   Testosterone ,Free and Total   CBC with Differential/Platelet   Meds ordered this encounter  Medications   testosterone  cypionate (DEPOTESTOSTERONE CYPIONATE) 200 MG/ML injection    Sig: Inject 0.5 mLs (100 mg total) into the muscle once a week.    Dispense:  10 mL    Refill:  1     I reviewed the patients updated PMH, FH, and SocHx.  Patient Active Problem List   Diagnosis Date Noted   Mixed hyperlipidemia 07/10/2023    Priority: High   Secondary polycythemia 07/06/2023    Priority: High   Major depression, recurrent, chronic 06/17/2022    Priority: High   Hypogonadism in male 01/16/2022    Priority: High   Transient homonymous hemianopia, right 05/16/2021    Priority: High   Basal ganglia infarction (HCC) 05/07/2021    Priority: High   HIV disease (HCC) 10/10/2015    Priority: High   GAD (generalized anxiety disorder) 10/10/2015    Priority: High   Essential hypertension 10/10/2015    Priority: High   Irritable bowel syndrome with diarrhea 01/07/2024    Priority: Medium    Tubular adenoma of colon 12/24/2022    Priority: Medium    H/O gastritis 06/17/2022    Priority: Medium    Secondary insomnia 06/17/2022    Priority: Medium    Schatzki's ring 10/10/2015    Priority: Medium    Morton's neuroma, left 10/10/2015    Priority: Medium    Vitamin B12 deficiency 06/03/2023    Priority: Low   Seasonal allergies 10/10/2015    Priority: Low   Folate deficiency 06/03/2023   Active Medications[1] Allergies: Patient has no known allergies. Family History: Patient family history includes Anxiety disorder in his father and maternal grandmother; Arthritis in his mother; Coronary artery disease in his father; Depression in his father and maternal grandmother; Hypertension in his father and paternal grandfather; Stroke in his father. Social History:  Patient  reports that he quit  smoking about 15 years ago. His smoking use included cigarettes. He smoked an average of 0.3 packs per day. He has never used smokeless tobacco. He reports current alcohol use of about 2.0 standard drinks of alcohol per week. He reports that he does not use drugs.  Review of Systems: Constitutional: Negative for fever malaise or anorexia Cardiovascular: negative for chest pain Respiratory: negative for SOB or persistent cough Gastrointestinal: negative for abdominal pain  Objective  Vitals: BP 120/86   Pulse 78   Temp 98.6 F (37 C)   Ht 5' 6 (1.676 m)   Wt 156 lb 12.8 oz (71.1 kg)   SpO2 95%   BMI 25.31 kg/m  General: no acute distress , A&Ox3  Commons side effects, risks, benefits, and alternatives for medications and treatment plan prescribed today were discussed, and the patient expressed understanding of the given instructions. Patient is instructed to call or message via MyChart if he/she has any questions or concerns regarding our treatment plan. No barriers to understanding were identified. We discussed Red Flag symptoms and signs in detail. Patient expressed understanding regarding what to do in case of urgent or emergency type symptoms.  Medication list was  reconciled, printed and provided to the patient in AVS. Patient instructions and summary information was reviewed with the patient as documented in the AVS. This note was prepared with assistance of Dragon voice recognition software. Occasional wrong-word or sound-a-like substitutions may have occurred due to the inherent limitations of voice recognition software     [1]  Current Meds  Medication Sig   aspirin  EC 81 MG tablet Take 1 tablet (81 mg total) by mouth daily.   baclofen  (LIORESAL ) 10 MG tablet Take 1 tablet (10 mg total) by mouth daily as needed for muscle spasms.   buPROPion  (WELLBUTRIN  XL) 300 MG 24 hr tablet Take 1 tablet (300 mg total) by mouth daily.   carvedilol  (COREG ) 6.25 MG tablet TAKE 1 TABLET TWICE  A DAY WITH MEALS   Cholecalciferol 25 MCG (1000 UT) capsule Take 1,000 Units by mouth daily.   clonazePAM  (KLONOPIN ) 0.5 MG disintegrating tablet Take 1-2 tablets (0.5-1 mg total) by mouth 2 (two) times daily as needed (sleep or anxiety).   dicyclomine  (BENTYL ) 10 MG capsule Take 1 capsule (10 mg total) by mouth 3 (three) times daily before meals.   Efavirenz -lamiVUDine -Tenofovir  600-300-300 MG TABS Take 1 tablet by mouth daily.   escitalopram  (LEXAPRO ) 10 MG tablet Take 10 mg by mouth daily.   fluticasone (FLONASE) 50 MCG/ACT nasal spray Place 2 sprays into both nostrils daily.   FOLIC ACID  PO Take 1,000 mg by mouth daily.   hydrochlorothiazide  (HYDRODIURIL ) 12.5 MG tablet TAKE 1 TABLET DAILY   indomethacin  (INDOCIN  SR) 75 MG CR capsule Take 1 capsule (75 mg total) by mouth 2 (two) times daily with a meal.   NEEDLE, DISP, 14 G 14G X 1 MISC 1 each by Does not apply route as needed.   NEEDLE, DISP, 25 G 25G X 1 MISC Use as directed   omeprazole  (PRILOSEC) 20 MG capsule TAKE 1 CAPSULE DAILY   rosuvastatin  (CRESTOR ) 10 MG tablet Take 1 tablet (10 mg total) by mouth daily.   Syringe, Disposable, 3 ML MISC 1 each by Does not apply route once a week.   tirzepatide  (ZEPBOUND ) 2.5 MG/0.5ML Pen Inject 2.5 mg into the skin once a week.   traZODone  (DESYREL ) 50 MG tablet Take 0.5-1 tablets (25-50 mg total) by mouth at bedtime as needed for sleep.   [DISCONTINUED] escitalopram  (LEXAPRO ) 20 MG tablet Take 1 tablet (20 mg total) by mouth daily.   [DISCONTINUED] testosterone  cypionate (DEPOTESTOSTERONE CYPIONATE) 200 MG/ML injection Inject 0.75 mLs (150 mg total) into the muscle every 14 (fourteen) days.

## 2024-05-07 LAB — TESTOSTERONE,FREE AND TOTAL
Testosterone, Free: 4.2 pg/mL — AB (ref 7.2–24.0)
Testosterone: 227 ng/dL — ABNORMAL LOW (ref 264–916)

## 2024-05-09 ENCOUNTER — Other Ambulatory Visit: Payer: Self-pay | Admitting: Family Medicine

## 2024-05-09 ENCOUNTER — Ambulatory Visit: Payer: Self-pay | Admitting: Family Medicine

## 2024-05-09 NOTE — Progress Notes (Signed)
 See mychart note Dear [name], Your testosterone  level is higher thane when we checked last time; again, odd since you are not on the medications. The last value seems to be an outlier.   Having said that, we will continue with our plan of restarting the T at 100 weekly and rechecking your am levels in 6 weeks.  Thanks, Dr. Jodie   Sincerely, Dr. Jodie

## 2024-06-07 ENCOUNTER — Ambulatory Visit: Admitting: Infectious Disease

## 2024-06-15 ENCOUNTER — Other Ambulatory Visit: Payer: Self-pay | Admitting: Family Medicine

## 2024-07-13 ENCOUNTER — Ambulatory Visit: Admitting: Infectious Disease
